# Patient Record
Sex: Female | Born: 1980
Health system: Southern US, Community
[De-identification: ages and names within clinical notes are randomized; demographics above are authoritative.]

## PROBLEM LIST (undated history)

## (undated) DIAGNOSIS — F32A Depression, unspecified: Secondary | ICD-10-CM

## (undated) DIAGNOSIS — E785 Hyperlipidemia, unspecified: Secondary | ICD-10-CM

## (undated) DIAGNOSIS — E7800 Pure hypercholesterolemia, unspecified: Secondary | ICD-10-CM

## (undated) DIAGNOSIS — F329 Major depressive disorder, single episode, unspecified: Secondary | ICD-10-CM

## (undated) DIAGNOSIS — F99 Mental disorder, not otherwise specified: Secondary | ICD-10-CM

## (undated) DIAGNOSIS — D649 Anemia, unspecified: Secondary | ICD-10-CM

## (undated) DIAGNOSIS — I459 Conduction disorder, unspecified: Secondary | ICD-10-CM

## (undated) DIAGNOSIS — F419 Anxiety disorder, unspecified: Secondary | ICD-10-CM

## (undated) DIAGNOSIS — O2230 Deep phlebothrombosis in pregnancy, unspecified trimester: Secondary | ICD-10-CM

## (undated) HISTORY — DX: Hyperlipidemia, unspecified: E78.5

## (undated) HISTORY — DX: Conduction disorder, unspecified: I45.9

---

## 2005-09-30 HISTORY — PX: WISDOM TOOTH EXTRACTION: SHX21

## 2015-05-15 LAB — OB RESULTS CONSOLE RPR: RPR: NONREACTIVE

## 2015-05-15 LAB — OB RESULTS CONSOLE GC/CHLAMYDIA
Chlamydia: NEGATIVE
Gonorrhea: NEGATIVE

## 2015-05-15 LAB — OB RESULTS CONSOLE HIV ANTIBODY (ROUTINE TESTING): HIV: NONREACTIVE

## 2015-05-15 LAB — OB RESULTS CONSOLE RUBELLA ANTIBODY, IGM: Rubella: IMMUNE

## 2015-05-15 LAB — OB RESULTS CONSOLE ABO/RH: RH Type: POSITIVE

## 2015-05-15 LAB — OB RESULTS CONSOLE HEPATITIS B SURFACE ANTIGEN: Hepatitis B Surface Ag: NEGATIVE

## 2015-05-15 LAB — OB RESULTS CONSOLE ANTIBODY SCREEN: Antibody Screen: NEGATIVE

## 2015-12-14 LAB — OB RESULTS CONSOLE GBS: GBS: NEGATIVE

## 2015-12-20 ENCOUNTER — Inpatient Hospital Stay (HOSPITAL_COMMUNITY)
Admission: AD | Admit: 2015-12-20 | Discharge: 2015-12-23 | DRG: 765 | Disposition: A | Discharge status: Home / Self Care | Payer: BLUE CROSS/BLUE SHIELD | Source: Ambulatory Visit | Attending: Obstetrics and Gynecology | Admitting: Obstetrics and Gynecology

## 2015-12-20 ENCOUNTER — Encounter (HOSPITAL_COMMUNITY): Payer: Self-pay | Admitting: *Deleted

## 2015-12-20 DIAGNOSIS — Z3A39 39 weeks gestation of pregnancy: Secondary | ICD-10-CM

## 2015-12-20 DIAGNOSIS — O4202 Full-term premature rupture of membranes, onset of labor within 24 hours of rupture: Secondary | ICD-10-CM | POA: Diagnosis present

## 2015-12-20 DIAGNOSIS — Z87891 Personal history of nicotine dependence: Secondary | ICD-10-CM | POA: Diagnosis not present

## 2015-12-20 DIAGNOSIS — Z3490 Encounter for supervision of normal pregnancy, unspecified, unspecified trimester: Secondary | ICD-10-CM

## 2015-12-20 DIAGNOSIS — D72829 Elevated white blood cell count, unspecified: Secondary | ICD-10-CM | POA: Diagnosis present

## 2015-12-20 DIAGNOSIS — O324XX0 Maternal care for high head at term, not applicable or unspecified: Principal | ICD-10-CM | POA: Diagnosis present

## 2015-12-20 HISTORY — DX: Mental disorder, not otherwise specified: F99

## 2015-12-20 HISTORY — DX: Anxiety disorder, unspecified: F41.9

## 2015-12-20 LAB — CBC
HCT: 38.2 % (ref 36.0–46.0)
Hemoglobin: 13.3 g/dL (ref 12.0–15.0)
MCH: 33.1 pg (ref 26.0–34.0)
MCHC: 34.8 g/dL (ref 30.0–36.0)
MCV: 95 fL (ref 78.0–100.0)
Platelets: 294 10*3/uL (ref 150–400)
RBC: 4.02 MIL/uL (ref 3.87–5.11)
RDW: 13.7 % (ref 11.5–15.5)
WBC: 13.4 10*3/uL — ABNORMAL HIGH (ref 4.0–10.5)

## 2015-12-20 LAB — TYPE AND SCREEN
ABO/RH(D): A POS
Antibody Screen: NEGATIVE

## 2015-12-20 LAB — AMNISURE RUPTURE OF MEMBRANE (ROM) NOT AT ARMC: Amnisure ROM: POSITIVE

## 2015-12-20 LAB — ABO/RH: ABO/RH(D): A POS

## 2015-12-20 MED ORDER — ONDANSETRON HCL 4 MG/2ML IJ SOLN: 4.0000 mg | Freq: Four times a day (QID) | INTRAMUSCULAR | Status: DC | PRN

## 2015-12-20 MED ORDER — LIDOCAINE HCL (PF) 1 % IJ SOLN
30.0000 mL | INTRAMUSCULAR | Status: DC | PRN
  Filled 2015-12-20: qty 30

## 2015-12-20 MED ORDER — OXYTOCIN 10 UNIT/ML IJ SOLN
1.0000 m[IU]/min | INTRAVENOUS | Status: DC
  Administered 2015-12-20: 2 m[IU]/min via INTRAVENOUS

## 2015-12-20 MED ORDER — OXYTOCIN 10 UNIT/ML IJ SOLN
2.5000 [IU]/h | INTRAVENOUS | Status: DC
  Filled 2015-12-20 (×2): qty 10

## 2015-12-20 MED ORDER — LACTATED RINGERS IV SOLN
INTRAVENOUS | Status: DC
  Administered 2015-12-20 – 2015-12-21 (×3): via INTRAVENOUS

## 2015-12-20 MED ORDER — OXYTOCIN BOLUS FROM INFUSION: 500.0000 mL | INTRAVENOUS | Status: DC

## 2015-12-20 MED ORDER — ACETAMINOPHEN 325 MG PO TABS: 650.0000 mg | ORAL_TABLET | ORAL | Status: DC | PRN

## 2015-12-20 MED ORDER — OXYCODONE-ACETAMINOPHEN 5-325 MG PO TABS: 1.0000 | ORAL_TABLET | ORAL | Status: DC | PRN

## 2015-12-20 MED ORDER — CITRIC ACID-SODIUM CITRATE 334-500 MG/5ML PO SOLN
30.0000 mL | ORAL | Status: DC | PRN
  Administered 2015-12-21: 30 mL via ORAL
  Filled 2015-12-20: qty 15

## 2015-12-20 MED ORDER — FLEET ENEMA 7-19 GM/118ML RE ENEM
1.0000 | ENEMA | RECTAL | Status: DC | PRN
Start: 2015-12-20 — End: 2015-12-22

## 2015-12-20 MED ORDER — OXYCODONE-ACETAMINOPHEN 5-325 MG PO TABS: 2.0000 | ORAL_TABLET | ORAL | Status: DC | PRN

## 2015-12-20 MED ORDER — LACTATED RINGERS IV SOLN
500.0000 mL | INTRAVENOUS | Status: DC | PRN
  Administered 2015-12-21: 500 mL via INTRAVENOUS

## 2015-12-20 MED ORDER — TERBUTALINE SULFATE 1 MG/ML IJ SOLN: 0.2500 mg | Freq: Once | INTRAMUSCULAR | Status: DC | PRN

## 2015-12-20 NOTE — H&P (Signed)
Kathy Tyler  DICTATION # [id number] CSN# [id number]   Meriel PicaHOLLAND,[name], MD 12/20/2015 2:35 PM

## 2015-12-20 NOTE — MAU Note (Addendum)
[facility] pt felt some leaking last night, had to change underwear.  Had gush of bloody fluid @ 0900 this morning.  SVE 1 cm.  Had cramping last night, mild uc's today.

## 2015-12-20 NOTE — Progress Notes (Signed)
Dr Marcelle OverlieHolland notified of pt admission status, FHR tracing, SVE, UCs, time of SROM, and GBS status. Orders given for expectant management.

## 2015-12-20 NOTE — H&P (Signed)
NAME:  Kathy CovertKINNEAR, Kathy               ACCOUNT NO.:  [id number]  MEDICAL RECORD NO.:  [id number]  LOCATION:                                 FACILITY:  PHYSICIAN:  Duke Salviaichard M. Marcelle OverlieHolland, M.D.DATE OF BIRTH:  1981-08-28  DATE OF ADMISSION:  12/20/2015 DATE OF DISCHARGE:                             HISTORY & PHYSICAL   CHIEF COMPLAINT:  Rupture of membranes at term.  HISTORY OF PRESENT ILLNESS:  A 35 year old, G2, P0-0-1-0, at term, was seen in the office earlier today with a small gush of fluid.  Nitrazine positive.  Fern negative.  Was sent to MAU  __________ was 8 pounds 2 ounces by ultrasound.  PAST MEDICAL HISTORY:  Please see the [facility] form for details.  PHYSICAL EXAM:  VITAL SIGNS:  Temp 98.2, blood pressure 108/60. HEENT:  Unremarkable. NECK:  Supple without masses. LUNGS:  Clear. CARDIOVASCULAR:  Regular rate and rhythm without murmurs, rubs, or gallops. BREASTS:  Not examined.  Term fundal height.  Fetal heart rate was 142. Cervix was 150%, minimal amount of clear fluid. EXTREMITIES:  Unremarkable. NEUROLOGIC:  Unremarkable.  IMPRESSION:  Term pregnancy, spontaneous rupture of membranes.  PLAN:  GBS is negative.  We anticipate vaginal delivery.     Cesia Orf M. Marcelle OverlieHolland, M.D.     RMH/MEDQ  D:  12/20/2015  T:  12/20/2015  Job:  [id number]

## 2015-12-21 ENCOUNTER — Inpatient Hospital Stay (HOSPITAL_COMMUNITY): Payer: BLUE CROSS/BLUE SHIELD | Admitting: Anesthesiology

## 2015-12-21 ENCOUNTER — Encounter (HOSPITAL_COMMUNITY)
Admission: AD | Disposition: A | Discharge status: Home / Self Care | Payer: Self-pay | Source: Ambulatory Visit | Attending: Obstetrics and Gynecology

## 2015-12-21 LAB — RPR: RPR Ser Ql: NONREACTIVE

## 2015-12-21 SURGERY — Surgical Case
Anesthesia: Epidural | Site: Abdomen

## 2015-12-21 MED ORDER — MEPERIDINE HCL 25 MG/ML IJ SOLN
INTRAMUSCULAR | Status: DC | PRN
  Administered 2015-12-21 (×2): 12.5 mg via INTRAVENOUS

## 2015-12-21 MED ORDER — PHENYLEPHRINE HCL 10 MG/ML IJ SOLN
10000.0000 ug | INTRAMUSCULAR | Status: DC | PRN
  Administered 2015-12-21 (×3): 80 ug via INTRAVENOUS
  Administered 2015-12-21: 40 ug via INTRAVENOUS

## 2015-12-21 MED ORDER — DIPHENHYDRAMINE HCL 50 MG/ML IJ SOLN: 12.5000 mg | INTRAMUSCULAR | Status: DC | PRN

## 2015-12-21 MED ORDER — FENTANYL 2.5 MCG/ML BUPIVACAINE 1/10 % EPIDURAL INFUSION (WH - ANES): 12.0000 mL/h | INTRAMUSCULAR | Status: DC | PRN

## 2015-12-21 MED ORDER — EPHEDRINE 5 MG/ML INJ: 10.0000 mg | INTRAVENOUS | Status: DC | PRN

## 2015-12-21 MED ORDER — LIDOCAINE HCL (PF) 1 % IJ SOLN
INTRAMUSCULAR | Status: DC | PRN
  Administered 2015-12-21 (×2): 4 mL

## 2015-12-21 MED ORDER — FENTANYL CITRATE (PF) 100 MCG/2ML IJ SOLN
INTRAMUSCULAR | Status: AC
  Filled 2015-12-21: qty 2

## 2015-12-21 MED ORDER — ONDANSETRON HCL 4 MG/2ML IJ SOLN
INTRAMUSCULAR | Status: DC | PRN
  Administered 2015-12-21: 4 mg via INTRAVENOUS

## 2015-12-21 MED ORDER — FENTANYL CITRATE (PF) 100 MCG/2ML IJ SOLN
INTRAMUSCULAR | Status: DC | PRN
  Administered 2015-12-21: 100 ug via EPIDURAL

## 2015-12-21 MED ORDER — SCOPOLAMINE 1 MG/3DAYS TD PT72
MEDICATED_PATCH | TRANSDERMAL | Status: DC | PRN
  Administered 2015-12-21: 1 via TRANSDERMAL

## 2015-12-21 MED ORDER — SODIUM CHLORIDE 0.9 % IR SOLN
Status: DC | PRN
  Administered 2015-12-21: 1000 mL

## 2015-12-21 MED ORDER — MORPHINE SULFATE (PF) 0.5 MG/ML IJ SOLN
INTRAMUSCULAR | Status: AC
  Filled 2015-12-21: qty 10

## 2015-12-21 MED ORDER — LACTATED RINGERS IV SOLN
INTRAVENOUS | Status: DC | PRN
  Administered 2015-12-21 (×3): via INTRAVENOUS

## 2015-12-21 MED ORDER — PHENYLEPHRINE 40 MCG/ML (10ML) SYRINGE FOR IV PUSH (FOR BLOOD PRESSURE SUPPORT)
80.0000 ug | PREFILLED_SYRINGE | INTRAVENOUS | Status: DC | PRN
  Filled 2015-12-21: qty 20

## 2015-12-21 MED ORDER — MICROFIBRILLAR COLL HEMOSTAT EX POWD
CUTANEOUS | Status: AC
  Filled 2015-12-21: qty 5

## 2015-12-21 MED ORDER — PHENYLEPHRINE 40 MCG/ML (10ML) SYRINGE FOR IV PUSH (FOR BLOOD PRESSURE SUPPORT): 80.0000 ug | PREFILLED_SYRINGE | INTRAVENOUS | Status: DC | PRN

## 2015-12-21 MED ORDER — OXYTOCIN 10 UNIT/ML IJ SOLN
40.0000 [IU] | INTRAVENOUS | Status: DC | PRN
  Administered 2015-12-21: 40 [IU] via INTRAVENOUS

## 2015-12-21 MED ORDER — MORPHINE SULFATE (PF) 0.5 MG/ML IJ SOLN
INTRAMUSCULAR | Status: DC | PRN
  Administered 2015-12-21: 4 mg via EPIDURAL
  Administered 2015-12-21: .5 mg via INTRAVENOUS
  Administered 2015-12-21: .5 mg via EPIDURAL

## 2015-12-21 MED ORDER — LACTATED RINGERS IV SOLN
500.0000 mL | Freq: Once | INTRAVENOUS | Status: AC
  Administered 2015-12-21: 500 mL via INTRAVENOUS

## 2015-12-21 MED ORDER — CEFAZOLIN SODIUM-DEXTROSE 2-4 GM/100ML-% IV SOLN
2.0000 g | Freq: Once | INTRAVENOUS | Status: AC
  Administered 2015-12-21: 2 g via INTRAVENOUS
  Filled 2015-12-21: qty 100

## 2015-12-21 MED ORDER — SODIUM BICARBONATE 8.4 % IV SOLN
INTRAVENOUS | Status: DC | PRN
  Administered 2015-12-21 (×3): 5 mL via EPIDURAL

## 2015-12-21 MED ORDER — MEPERIDINE HCL 25 MG/ML IJ SOLN
INTRAMUSCULAR | Status: AC
  Filled 2015-12-21: qty 1

## 2015-12-21 MED ORDER — FENTANYL 2.5 MCG/ML BUPIVACAINE 1/10 % EPIDURAL INFUSION (WH - ANES)
14.0000 mL/h | INTRAMUSCULAR | Status: DC | PRN
  Administered 2015-12-21: 14 mL/h via EPIDURAL
  Administered 2015-12-21: 12 mL/h via EPIDURAL
  Filled 2015-12-21 (×2): qty 125

## 2015-12-21 SURGICAL SUPPLY — 27 items
CLAMP CORD UMBIL (MISCELLANEOUS) IMPLANT
CLOTH BEACON ORANGE TIMEOUT ST (SAFETY) ×2 IMPLANT
DRSG OPSITE POSTOP 4X10 (GAUZE/BANDAGES/DRESSINGS) ×2 IMPLANT
DURAPREP 26ML APPLICATOR (WOUND CARE) ×2 IMPLANT
ELECT REM PT RETURN 9FT ADLT (ELECTROSURGICAL) ×2
ELECTRODE REM PT RTRN 9FT ADLT (ELECTROSURGICAL) ×1 IMPLANT
EXTRACTOR VACUUM M CUP 4 TUBE (SUCTIONS) IMPLANT
GLOVE BIO SURGEON STRL SZ 6.5 (GLOVE) ×2 IMPLANT
GLOVE BIOGEL PI IND STRL 7.0 (GLOVE) ×3 IMPLANT
GLOVE BIOGEL PI INDICATOR 7.0 (GLOVE) ×3
GOWN STRL REUS W/TWL LRG LVL3 (GOWN DISPOSABLE) ×4 IMPLANT
KIT ABG SYR 3ML LUER SLIP (SYRINGE) IMPLANT
LIQUID BAND (GAUZE/BANDAGES/DRESSINGS) ×2 IMPLANT
NEEDLE HYPO 25X5/8 SAFETYGLIDE (NEEDLE) IMPLANT
NS IRRIG 1000ML POUR BTL (IV SOLUTION) ×2 IMPLANT
PACK C SECTION WH (CUSTOM PROCEDURE TRAY) ×2 IMPLANT
PAD OB MATERNITY 4.3X12.25 (PERSONAL CARE ITEMS) ×2 IMPLANT
PENCIL SMOKE EVAC W/HOLSTER (ELECTROSURGICAL) ×2 IMPLANT
SUT CHROMIC 0 CT 802H (SUTURE) IMPLANT
SUT CHROMIC 0 CTX 36 (SUTURE) ×6 IMPLANT
SUT MON AB-0 CT1 36 (SUTURE) ×2 IMPLANT
SUT PDS AB 0 CTX 60 (SUTURE) ×2 IMPLANT
SUT PLAIN 0 NONE (SUTURE) IMPLANT
SUT VIC AB 4-0 KS 27 (SUTURE) ×2 IMPLANT
SYR BULB 3OZ (MISCELLANEOUS) ×2 IMPLANT
TOWEL OR 17X24 6PK STRL BLUE (TOWEL DISPOSABLE) ×2 IMPLANT
TRAY FOLEY CATH SILVER 14FR (SET/KITS/TRAYS/PACK) IMPLANT

## 2015-12-21 NOTE — Progress Notes (Signed)
Pt comfortable, feeling mild ctx  FHT cat 1 Toco Q1-2 Cvx 3/70/-2, vtx, AROM forebag IUPC placed  A/P:  Exp mngt Pitocin augmentation Epidural prn

## 2015-12-21 NOTE — Anesthesia Preprocedure Evaluation (Signed)
Anesthesia Evaluation  Patient identified by MRN, date of birth, ID band Patient awake    Reviewed: Allergy & Precautions, NPO status , Patient's Chart, lab work & pertinent test results  Airway Mallampati: II  TM Distance: >3 FB Neck ROM: Full    Dental no notable dental hx.    Pulmonary former smoker,    Pulmonary exam normal breath sounds clear to auscultation       Cardiovascular negative cardio ROS Normal cardiovascular exam Rhythm:Regular Rate:Normal     Neuro/Psych PSYCHIATRIC DISORDERS Anxiety negative neurological ROS     GI/Hepatic negative GI ROS, Neg liver ROS,   Endo/Other  negative endocrine ROS  Renal/GU negative Renal ROS     Musculoskeletal negative musculoskeletal ROS (+)   Abdominal   Peds  Hematology negative hematology ROS (+)   Anesthesia Other Findings   Reproductive/Obstetrics (+) Pregnancy                             Anesthesia Physical Anesthesia Plan  ASA: II  Anesthesia Plan: Epidural   Post-op Pain Management:    Induction:   Airway Management Planned:   Additional Equipment:   Intra-op Plan:   Post-operative Plan:   Informed Consent: I have reviewed the patients History and Physical, chart, labs and discussed the procedure including the risks, benefits and alternatives for the proposed anesthesia with the patient or authorized representative who has indicated his/her understanding and acceptance.     Plan Discussed with:   Anesthesia Plan Comments:         Anesthesia Quick Evaluation

## 2015-12-21 NOTE — Op Note (Signed)
Cesarean Section Procedure Note   Kathy Tyler  12/20/2015 - 12/21/2015  Indications: arrest of descent   Pre-operative Diagnosis: arrest of descent .   Post-operative Diagnosis: Same   Surgeon: Surgeon(s) and Role:    * Zelphia CairoGretchen Rilynne Lonsway, MD - Primary   Assistants: none  Anesthesia: epidural   Procedure Details:  The patient was seen in the Holding Room. The risks, benefits, complications, treatment options, and expected outcomes were discussed with the patient. The patient concurred with the proposed plan, giving informed consent. identified as Kathy Tyler and the procedure verified as C-Section Delivery. A Time Out was held and the above information confirmed.  After induction of anesthesia, the patient was draped and prepped in the usual sterile manner. A transverse was made and carried down through the subcutaneous tissue to the fascia. Fascial incision was made and extended transversely. The fascia was separated from the underlying rectus tissue superiorly and inferiorly. The peritoneum was identified and entered. Peritoneal incision was extended longitudinally. The utero-vesical peritoneal reflection was incised transversely and the bladder flap was bluntly freed from the lower uterine segment. A low transverse uterine incision was made. Delivered from cephalic OP presentation was a vigorous female with Apgar scores of 9 at one minute and 9 at five minutes. Cord ph was not sent the umbilical cord was clamped and cut cord blood was obtained for evaluation. The placenta was removed Intact and appeared normal. The uterine outline, tubes and ovaries appeared normal}. The uterine incision was closed with running locked sutures of 0chromic gut.   Hemostasis was observed. Lavage was carried out until clear.  Left cervical laceration repaired with chromic.  Peritoneum was closed with 0 monocryl.  The fascia was then reapproximated with running sutures of 0PDS.  The skin was closed with 4-0Vicryl.    Instrument, sponge, and needle counts were correct prior the abdominal closure and were correct at the conclusion of the case.    Estimated Blood Loss: 650cc  Urine Output: clearing  Complications: no complications  Disposition: PACU - hemodynamically stable.   Maternal Condition: stable   Baby condition / location:  Couplet care / Skin to Skin  Attending Attestation: I was present and scrubbed for the entire procedure.   Signed: Surgeon(s): Zelphia CairoGretchen Juleen Sorrels, MD

## 2015-12-21 NOTE — Progress Notes (Signed)
Pt comfortable w/ epidural  FHT cat 1 Toco Q2 Cvx 5/90/-2, vtx forebag ruptured for large clear fluid  A/P:  Exp mngt

## 2015-12-21 NOTE — Progress Notes (Signed)
Pt pushing x 1.5 hrs then labored down for several hours Now pushing again x 30 min - very little progress, good effort  FHT reassuring Toco Q2  A/P:  Plan to recheck in - if no progress - rec primary c-section Pt and husband agree with plan of care

## 2015-12-21 NOTE — Anesthesia Procedure Notes (Signed)
Epidural Patient location during procedure: OB  Staffing Anesthesiologist: Lewie LoronGERMEROTH, Maddilynn Esperanza Performed by: anesthesiologist   Preanesthetic Checklist Completed: patient identified, pre-op evaluation, timeout performed, IV checked, risks and benefits discussed and monitors and equipment checked  Epidural Patient position: sitting Prep: site prepped and draped and DuraPrep Patient monitoring: heart rate Approach: midline Injection technique: LOR air and LOR saline  Needle:  Needle type: Tuohy  Needle gauge: 17 G Needle length: 9 cm Needle insertion depth: 4 cm Catheter type: closed end flexible Catheter size: 19 Gauge Catheter at skin depth: 10 cm Test dose: negative  Assessment Sensory level: T8 Events: blood not aspirated, injection not painful, no injection resistance, negative IV test and no paresthesia  Additional Notes Reason for block:procedure for pain

## 2015-12-21 NOTE — Progress Notes (Signed)
Pt with no descent despite > 2 hours of pushing.   Rec primary c-section, pt and husband agree with plan of care R/b/a discussed, questions answered, informed consent

## 2015-12-22 ENCOUNTER — Encounter (HOSPITAL_COMMUNITY): Payer: Self-pay | Admitting: *Deleted

## 2015-12-22 LAB — CBC
HCT: 27.5 % — ABNORMAL LOW (ref 36.0–46.0)
Hemoglobin: 9.8 g/dL — ABNORMAL LOW (ref 12.0–15.0)
MCH: 33.6 pg (ref 26.0–34.0)
MCHC: 35.6 g/dL (ref 30.0–36.0)
MCV: 94.2 fL (ref 78.0–100.0)
Platelets: 233 10*3/uL (ref 150–400)
RBC: 2.92 MIL/uL — ABNORMAL LOW (ref 3.87–5.11)
RDW: 13.8 % (ref 11.5–15.5)
WBC: 33.1 10*3/uL — ABNORMAL HIGH (ref 4.0–10.5)

## 2015-12-22 MED ORDER — SENNOSIDES-DOCUSATE SODIUM 8.6-50 MG PO TABS
2.0000 | ORAL_TABLET | ORAL | Status: DC
  Administered 2015-12-23: 2 via ORAL
  Filled 2015-12-22: qty 2

## 2015-12-22 MED ORDER — DEXAMETHASONE SODIUM PHOSPHATE 4 MG/ML IJ SOLN
INTRAMUSCULAR | Status: AC
  Filled 2015-12-22: qty 1

## 2015-12-22 MED ORDER — IBUPROFEN 600 MG PO TABS
600.0000 mg | ORAL_TABLET | Freq: Four times a day (QID) | ORAL | Status: DC
  Administered 2015-12-22 – 2015-12-23 (×5): 600 mg via ORAL
  Filled 2015-12-22 (×5): qty 1

## 2015-12-22 MED ORDER — SIMETHICONE 80 MG PO CHEW
80.0000 mg | CHEWABLE_TABLET | ORAL | Status: DC
  Administered 2015-12-23: 80 mg via ORAL
  Filled 2015-12-22: qty 1

## 2015-12-22 MED ORDER — OXYTOCIN 10 UNIT/ML IJ SOLN: 2.5000 [IU]/h | INTRAVENOUS | Status: AC

## 2015-12-22 MED ORDER — MEPERIDINE HCL 25 MG/ML IJ SOLN: 6.2500 mg | INTRAMUSCULAR | Status: DC | PRN

## 2015-12-22 MED ORDER — MENTHOL 3 MG MT LOZG: 1.0000 | LOZENGE | OROMUCOSAL | Status: DC | PRN

## 2015-12-22 MED ORDER — FENTANYL CITRATE (PF) 100 MCG/2ML IJ SOLN: 25.0000 ug | INTRAMUSCULAR | Status: DC | PRN

## 2015-12-22 MED ORDER — LACTATED RINGERS IV SOLN
INTRAVENOUS | Status: DC
Start: 2015-12-22 — End: 2015-12-23

## 2015-12-22 MED ORDER — TETANUS-DIPHTH-ACELL PERTUSSIS 5-2.5-18.5 LF-MCG/0.5 IM SUSP: 0.5000 mL | Freq: Once | INTRAMUSCULAR | Status: DC

## 2015-12-22 MED ORDER — KETOROLAC TROMETHAMINE 30 MG/ML IJ SOLN
30.0000 mg | Freq: Four times a day (QID) | INTRAMUSCULAR | Status: AC | PRN
Start: 2015-12-22 — End: 2015-12-23

## 2015-12-22 MED ORDER — ZOLPIDEM TARTRATE 5 MG PO TABS: 5.0000 mg | ORAL_TABLET | Freq: Every evening | ORAL | Status: DC | PRN

## 2015-12-22 MED ORDER — OXYCODONE-ACETAMINOPHEN 5-325 MG PO TABS: 1.0000 | ORAL_TABLET | ORAL | Status: DC | PRN

## 2015-12-22 MED ORDER — DIBUCAINE 1 % RE OINT: 1.0000 "application " | TOPICAL_OINTMENT | RECTAL | Status: DC | PRN

## 2015-12-22 MED ORDER — OXYTOCIN 10 UNIT/ML IJ SOLN
INTRAMUSCULAR | Status: AC
  Filled 2015-12-22: qty 4

## 2015-12-22 MED ORDER — DIPHENHYDRAMINE HCL 25 MG PO CAPS: 25.0000 mg | ORAL_CAPSULE | ORAL | Status: DC | PRN

## 2015-12-22 MED ORDER — DIPHENHYDRAMINE HCL 25 MG PO CAPS: 25.0000 mg | ORAL_CAPSULE | Freq: Four times a day (QID) | ORAL | Status: DC | PRN

## 2015-12-22 MED ORDER — NALBUPHINE HCL 10 MG/ML IJ SOLN: 5.0000 mg | Freq: Once | INTRAMUSCULAR | Status: DC | PRN

## 2015-12-22 MED ORDER — PROMETHAZINE HCL 25 MG/ML IJ SOLN: 6.2500 mg | INTRAMUSCULAR | Status: DC | PRN

## 2015-12-22 MED ORDER — ACETAMINOPHEN 325 MG PO TABS: 650.0000 mg | ORAL_TABLET | ORAL | Status: DC | PRN

## 2015-12-22 MED ORDER — ONDANSETRON HCL 4 MG/2ML IJ SOLN
INTRAMUSCULAR | Status: AC
  Filled 2015-12-22: qty 2

## 2015-12-22 MED ORDER — DIPHENHYDRAMINE HCL 50 MG/ML IJ SOLN: 12.5000 mg | INTRAMUSCULAR | Status: DC | PRN

## 2015-12-22 MED ORDER — NALBUPHINE HCL 10 MG/ML IJ SOLN
5.0000 mg | INTRAMUSCULAR | Status: DC | PRN
Start: 2015-12-22 — End: 2015-12-23

## 2015-12-22 MED ORDER — SIMETHICONE 80 MG PO CHEW: 80.0000 mg | CHEWABLE_TABLET | ORAL | Status: DC | PRN

## 2015-12-22 MED ORDER — SCOPOLAMINE 1 MG/3DAYS TD PT72
MEDICATED_PATCH | TRANSDERMAL | Status: AC
  Filled 2015-12-22: qty 1

## 2015-12-22 MED ORDER — LACTATED RINGERS IV SOLN
INTRAVENOUS | Status: DC
  Administered 2015-12-22: 02:00:00 via INTRAVENOUS

## 2015-12-22 MED ORDER — SIMETHICONE 80 MG PO CHEW
80.0000 mg | CHEWABLE_TABLET | Freq: Three times a day (TID) | ORAL | Status: DC
  Administered 2015-12-22 – 2015-12-23 (×3): 80 mg via ORAL
  Filled 2015-12-22 (×3): qty 1

## 2015-12-22 MED ORDER — NALOXONE HCL 0.4 MG/ML IJ SOLN: 0.4000 mg | INTRAMUSCULAR | Status: DC | PRN

## 2015-12-22 MED ORDER — KETOROLAC TROMETHAMINE 30 MG/ML IJ SOLN: 30.0000 mg | Freq: Four times a day (QID) | INTRAMUSCULAR | Status: AC | PRN

## 2015-12-22 MED ORDER — WITCH HAZEL-GLYCERIN EX PADS: 1.0000 "application " | MEDICATED_PAD | CUTANEOUS | Status: DC | PRN

## 2015-12-22 MED ORDER — DEXAMETHASONE SODIUM PHOSPHATE 4 MG/ML IJ SOLN
INTRAMUSCULAR | Status: DC | PRN
  Administered 2015-12-21: 4 mg via INTRAVENOUS

## 2015-12-22 MED ORDER — NALBUPHINE HCL 10 MG/ML IJ SOLN: 5.0000 mg | INTRAMUSCULAR | Status: DC | PRN

## 2015-12-22 MED ORDER — SODIUM CHLORIDE 0.9% FLUSH: 3.0000 mL | INTRAVENOUS | Status: DC | PRN

## 2015-12-22 MED ORDER — ONDANSETRON HCL 4 MG/2ML IJ SOLN: 4.0000 mg | Freq: Three times a day (TID) | INTRAMUSCULAR | Status: DC | PRN

## 2015-12-22 MED ORDER — NALOXONE HCL 2 MG/2ML IJ SOSY
1.0000 ug/kg/h | PREFILLED_SYRINGE | INTRAVENOUS | Status: DC | PRN
  Filled 2015-12-22: qty 2

## 2015-12-22 MED ORDER — KETOROLAC TROMETHAMINE 30 MG/ML IJ SOLN
INTRAMUSCULAR | Status: AC
  Administered 2015-12-22: 30 mg
  Filled 2015-12-22: qty 1

## 2015-12-22 MED ORDER — PRENATAL MULTIVITAMIN CH
1.0000 | ORAL_TABLET | Freq: Every day | ORAL | Status: DC
  Administered 2015-12-22 – 2015-12-23 (×2): 1 via ORAL
  Filled 2015-12-22 (×2): qty 1

## 2015-12-22 MED ORDER — SCOPOLAMINE 1 MG/3DAYS TD PT72
1.0000 | MEDICATED_PATCH | Freq: Once | TRANSDERMAL | Status: DC
  Filled 2015-12-22: qty 1

## 2015-12-22 MED ORDER — OXYCODONE-ACETAMINOPHEN 5-325 MG PO TABS: 2.0000 | ORAL_TABLET | ORAL | Status: DC | PRN

## 2015-12-22 MED ORDER — LANOLIN HYDROUS EX OINT: 1.0000 "application " | TOPICAL_OINTMENT | CUTANEOUS | Status: DC | PRN

## 2015-12-22 NOTE — Addendum Note (Signed)
Addendum  created 12/22/15 0911 by Jhonnie GarnerBeth M Dalyn Kjos, CRNA   Modules edited: Clinical Notes   Clinical Notes:  File: [id number]

## 2015-12-22 NOTE — Anesthesia Postprocedure Evaluation (Signed)
Anesthesia Post Note  Patient: Kathy Tyler  Procedure(s) Performed: Procedure(s) (LRB): CESAREAN SECTION (N/A)  Patient location during evaluation: Mother Baby Anesthesia Type: Epidural Level of consciousness: oriented and awake and alert Pain management: pain level controlled Vital Signs Assessment: post-procedure vital signs reviewed and stable Respiratory status: spontaneous breathing Cardiovascular status: blood pressure returned to baseline Postop Assessment: no headache, no backache, no signs of nausea or vomiting, adequate PO intake and patient able to bend at knees Anesthetic complications: no    Last Vitals:  Filed Vitals:   12/22/15 0515 12/22/15 0656  BP: 105/62 102/49  Pulse: 72 74  Temp: 36.8 C 36.6 C  Resp: 18 18    Last Pain:  Filed Vitals:   12/22/15 0657  PainSc: 0-No pain                 Elley Harp

## 2015-12-22 NOTE — Lactation Note (Signed)
This note was copied from a baby's chart. Lactation Consultation Note  Patient Name: Kathy Tyler YNWGN'FToday's Date: 12/22/2015 Reason for consult: Initial assessment   With this mom of a term baby, now 5514 hours old. He has breast fed 3 times since birth. Mom had tried football, so I showed her how to position herself and baby for football, and were able to get baby deeply latched to her left breast. Mom has easily expressed colostrum, and the baby is very mellow - sucking on and off, but good breast movement and strong pulls when sucking. Basic breast feeding teaching and lactation services reviewed with mom. Mom very receptive to teaching, and knows to call for questions/concerns.    Maternal Data Formula Feeding for Exclusion: No Has patient been taught Hand Expression?: Yes Does the patient have breastfeeding experience prior to this delivery?: No  Feeding Feeding Type: Breast Fed Length of feed: 15 min (baby suckles on and off, very strong suckles when sucking)  LATCH Score/Interventions Latch: Grasps breast easily, tongue down, lips flanged, rhythmical sucking.  Audible Swallowing: A few with stimulation Intervention(s): Skin to skin;Hand expression  Type of Nipple: Everted at rest and after stimulation  Comfort (Breast/Nipple): Soft / non-tender     Hold (Positioning): Assistance needed to correctly position infant at breast and maintain latch. Intervention(s): Breastfeeding basics reviewed;Support Pillows;Position options;Skin to skin  LATCH Score: 8  Lactation Tools Discussed/Used     Consult Status Consult Status: Follow-up Date: 12/23/15 Follow-up type: In-patient    [name], [name] 12/22/2015, 2:11 PM

## 2015-12-22 NOTE — Progress Notes (Signed)
Subjective: Postpartum Day 1: Cesarean Delivery Patient reports tolerating PO.    Objective: Vital signs in last 24 hours: Temp:  [97.8 F (36.6 C)-99.5 F (37.5 C)] 97.8 F (36.6 C) (03/24 0656) Pulse Rate:  [63-106] 74 (03/24 0656) Resp:  [11-22] 18 (03/24 0656) BP: (97-134)/(49-89) 102/49 mmHg (03/24 0656) SpO2:  [95 %-100 %] 98 % (03/24 0410)  Physical Exam:  General: alert and cooperative Lochia: appropriate Uterine Fundus: firm Incision: healing well DVT Evaluation: No evidence of DVT seen on physical exam. Negative Homan's sign. No cords or calf tenderness. No significant calf/ankle edema. Labial edema noted  Recent Labs  12/20/15 1520 12/22/15 0542  HGB 13.3 9.8*  HCT 38.2 27.5*    Assessment/Plan: Status post Cesarean section. Postoperative course complicated by leukocytosis  Cbc ordered for am.  Vianny Schraeder G 12/22/2015, 7:15 AM

## 2015-12-22 NOTE — Transfer of Care (Signed)
Immediate Anesthesia Transfer of Care Note  Patient: Kathy Tyler  Procedure(s) Performed: Procedure(s): CESAREAN SECTION (N/A)  Patient Location: PACU  Anesthesia Type:Epidural  Level of Consciousness: awake, alert  and oriented  Airway & Oxygen Therapy: Patient Spontanous Breathing  Post-op Assessment: Report given to RN and Post -op Vital signs reviewed and stable  Post vital signs: Reviewed and stable  Last Vitals:  Filed Vitals:   12/21/15 2101 12/21/15 2131  BP: 121/73 118/74  Pulse: 77 95  Temp: 36.8 C   Resp: 20     Complications: No apparent anesthesia complications

## 2015-12-22 NOTE — Clinical Social Work Maternal (Signed)
CLINICAL SOCIAL WORK MATERNAL/CHILD NOTE  Patient Details  Name: Kathy Tyler MRN: [id number] Date of Birth: 06/18/1981  Date:  12/22/2015  Clinical Social Worker Initiating Note:  Keanen Dohse MSW, LCSW Date/ Time Initiated:  12/22/15/1420     Child's Name:  Kathy Tyler   Legal Guardian:  Reed and Kasey Shenk  Need for Interpreter:  None   Date of Referral:  12/21/15     Reason for Referral:  History of anxiety  Referral Source:  Central Nursery   Address:  [address] [place], [place] [address]  Phone number:  [phone]   Household Members:  Spouse   Natural Supports (not living in the home):  Extended Family, Immediate Family   Professional Supports: None   Employment: Full-time   Type of Work:   Customer service  Education:  College graduate   Financial Resources:  Private Insurance   Other Resources:    None identified   Cultural/Religious Considerations Which May Impact Care:  None reported  Strengths:  Ability to meet basic needs , Home prepared for child , Pediatrician chosen    Risk Factors/Current Problems:   1. Mental Health Concerns-- [name] presents with a history of anxiety/OCD. [name] reported that all medications were discontinued approximately one year ago in anticipating of a pregnancy. [name] denied change in symptoms or presence of acute anxiety during the pregnancy.    Cognitive State:  Able to Concentrate , Alert , Goal Oriented , Linear Thinking , Insightful    Mood/Affect:  Happy , Animated, Bright , Calm , Comfortable    CSW Assessment:  CSW received request for consult due to [name] presenting with a history of anxiety.  [name] was noted to be in a pleasant mood and displayed a full range in affect. She was receptive to the CSW, easily engaged, and expressed appreciation for the visit.  [name] stated that she experienced a "long" childbirth experience, and reported that she had anticipated a C-section due to her narrow hips. [name] shared that  she had mentally prepared for a potential C-section prior to delivery, and had sufficient time while in L&D to prepare for the procedure.  [name] denied belief that the procedure was "scary", and shared that the recovery is what she anticipated and expect.  [name] expressed initial difficulties with breastfeeding, but stated that it is congruent with what she anticipated and expected. She stated that she been working with the LC, and has found it helpful. Per [name], she feels happy and excited as she anticipates returning home. She stated that both of their families live nearby, and that she feels well supported. [name] denied any barriers to asking for and accepting help.    [name] confirmed history of anxiety and OCD. She stated that she has "always" had symptoms.  Per [name], she has a history of medication management by her PCP for symptoms, and all medications were discontinued one year ago as they prepared for the pregnancy. [name] denied difficulties with discontinuing medication, and shared belief that it was closely related to her being distracted by preparations for the pregnancy and preparing for the infant.  [name] denied presence of acute symptoms, and stated that she had occassionally anxiety, but it never interfered with her activities of daily living, relationships, or ability to sleep.  [name] stated that she has concerns about her risk for perinatal mood disorders due to her history of anxiety. She presented with previous awareness of her increased risk.  CSW provided education on the baby blues and perinatal   mood and anxiety disorders. [name] expressed appreciation to know what to look for, and shared that she will closely monitor and will follow up with her PCP or OB if she notes onset of symptoms. CSW reviewed best practices for perinatal mood disorders, and highlighted non-pharmalogical interventions that can help to support her mental health as she now transitions postpartum.  [name] denied questions, concerns, or needs at  this time. She acknowledged ongoing CSW availability, and agreed to contact CSW if needs arise.  CSW Plan/Description:   1. Patient/Family Education-- perinatal mood and anxiety disorders 2. Information/Referral to Community Resources- Feelings After Birth support group 3. No Further Intervention Required/No Barriers to Discharge    Vic Esco N, LCSW 12/22/2015, 2:56 PM  

## 2015-12-22 NOTE — Anesthesia Postprocedure Evaluation (Signed)
Anesthesia Post Note  Patient: Kathy Tyler  Procedure(s) Performed: Procedure(s) (LRB): CESAREAN SECTION (N/A)  Patient location during evaluation: PACU Anesthesia Type: Epidural Level of consciousness: oriented and awake and alert Pain management: pain level controlled Vital Signs Assessment: post-procedure vital signs reviewed and stable Respiratory status: spontaneous breathing, respiratory function stable and patient connected to nasal cannula oxygen Cardiovascular status: blood pressure returned to baseline and stable Postop Assessment: no headache, no backache and epidural receding Anesthetic complications: no    Last Vitals:  Filed Vitals:   12/22/15 0130 12/22/15 0145  BP: 106/68 111/62  Pulse: 89 76  Temp:  37.5 C  Resp: 20 19    Last Pain:  Filed Vitals:   12/22/15 0152  PainSc: 2                  Shelton SilvasKevin D Kurt Azimi

## 2015-12-23 ENCOUNTER — Encounter (HOSPITAL_COMMUNITY): Payer: Self-pay | Admitting: Obstetrics and Gynecology

## 2015-12-23 LAB — CBC
HCT: 23.5 % — ABNORMAL LOW (ref 36.0–46.0)
Hemoglobin: 8.2 g/dL — ABNORMAL LOW (ref 12.0–15.0)
MCH: 33.3 pg (ref 26.0–34.0)
MCHC: 34.9 g/dL (ref 30.0–36.0)
MCV: 95.5 fL (ref 78.0–100.0)
Platelets: 198 10*3/uL (ref 150–400)
RBC: 2.46 MIL/uL — ABNORMAL LOW (ref 3.87–5.11)
RDW: 14.1 % (ref 11.5–15.5)
WBC: 18 10*3/uL — ABNORMAL HIGH (ref 4.0–10.5)

## 2015-12-23 MED ORDER — OXYCODONE-ACETAMINOPHEN 5-325 MG PO TABS: 1.0000 | ORAL_TABLET | Freq: Four times a day (QID) | ORAL | Status: DC | PRN

## 2015-12-23 MED ORDER — IBUPROFEN 600 MG PO TABS: 600.0000 mg | ORAL_TABLET | Freq: Four times a day (QID) | ORAL | Status: DC

## 2015-12-23 NOTE — Lactation Note (Signed)
This note was copied from a baby's chart. Lactation Consultation Note  Patient Name: Kathy Tyler's Date: 12/23/2015 Reason for consult: Follow-up assessment Nurse called for LC to come talk with mom before discharge. Baby was BF on left breast in football hold- mom was bent over. Suggested mom add more pillow support so she can sit up straight & not have a sore back later. Parents report the nurse told them to put infant in direct sunlight & they will go to the pediatrician tomorrow morning for a check-up. Encouraged mom to BF on cue at least 8-12x/d. Also informed mom that if at the pediatrician they are concerned about his bili or wt that she could post-pump with her DEBP at home and then spoon feed it to infant after BF. Reminded mom of lactation o/p number if they have further questions once they're home. Mom was unsure how to notice swallows so LC showed mom; mom reported that she had heard them earlier but wasn't aware that they were swallows. Mom reported some soreness, especially at the beginning of a feed but it gets better; explained to mom that this can be normal but that it should not be painful during the entire feed or after BF. After baby came off, encouraged mom to rub expressed breast milk into her nipple (mom demonstrated hand expression very well & drops were seen right away). Mom's nipple was a little pinched so encouraged mom to aim to have infant's mouth wider/ deeper latch at future feeds. Parents report no other questions at this time.  Maternal Data    Feeding Feeding Type: Breast Fed Length of feed: 15 min  LATCH Score/Interventions                      Lactation Tools Discussed/Used     Consult Status Consult Status: Complete    Kathy Tyler 12/23/2015, 3:57 PM

## 2015-12-23 NOTE — Progress Notes (Signed)
Subjective: Postpartum Day 2: Cesarean Delivery Patient reports tolerating PO, + flatus and no problems voiding.    Objective: Vital signs in last 24 hours: Temp:  [97.4 F (36.3 C)-98.6 F (37 C)] 98 F (36.7 C) (03/25 0554) Pulse Rate:  [57-82] 57 (03/25 0554) Resp:  [18-20] 20 (03/25 0554) BP: (93-107)/(51-64) 93/51 mmHg (03/25 0554)  Physical Exam:  General: alert, cooperative and no distress Lochia: appropriate Uterine Fundus: firm Incision: healing well DVT Evaluation: No evidence of DVT seen on physical exam.   Recent Labs  12/22/15 0542 12/23/15 0615  HGB 9.8* 8.2*  HCT 27.5* 23.5*    Assessment/Plan: Status post Cesarean section. Doing well postoperatively.  Discharge home with standard precautions and return to clinic in 4-6 weeks.  Usama Harkless II,Pepper Kerrick E 12/23/2015, 8:06 AM

## 2015-12-23 NOTE — Lactation Note (Signed)
This note was copied from a baby's chart. Lactation Consultation Note  Baby recently breastfed for 45 min.  Mother states breastfeeding going well. Nipples slightly sore.  Recommend applying ebm and coconut oil. Gave mother hand pump.  Mom encouraged to feed baby 8-12 times/24 hours and with feeding cues.  Reviewed OP services. Reviewed engorgement care and monitoring voids/stools.   Patient Name: Boy Leonia Readerbbie Woolum HYQMV'HToday's Date: 12/23/2015 Reason for consult: Follow-up assessment   Maternal Data    Feeding Feeding Type: Breast Fed Length of feed: 45 min  LATCH Score/Interventions                      Lactation Tools Discussed/Used     Consult Status Consult Status: Complete    Hardie PulleyBerkelhammer, Ruth Boschen 12/23/2015, 8:34 AM

## 2015-12-23 NOTE — Discharge Summary (Signed)
Obstetric Discharge Summary Reason for Admission: rupture of membranes Prenatal Procedures: none Intrapartum Procedures: cesarean: low cervical, transverse Postpartum Procedures: none Complications-Operative and Postpartum: none HEMOGLOBIN  Date Value Ref Range Status  12/23/2015 8.2* 12.0 - 15.0 g/dL Final   HCT  Date Value Ref Range Status  12/23/2015 23.5* 36.0 - 46.0 % Final    Physical Exam:  General: alert, no distress Lochia: appropriate Uterine Fundus: firm Incision: healing well DVT Evaluation: No evidence of DVT seen on physical exam.  Discharge Diagnoses: Term Pregnancy-delivered  Discharge Information: Date: 12/23/2015 Activity: pelvic rest Diet: routine Medications: PNV, Ibuprofen and Percocet Condition: stable Instructions: refer to practice specific booklet Discharge to: home   Newborn Data: Live born female  Birth Weight: 8 lb 9.2 oz (3890 g) APGAR: 9, 9  Home with mother.  Kathy Tyler,Kathy Tyler E 12/23/2015, 8:08 AM

## 2016-01-01 NOTE — Addendum Note (Signed)
Addendum  created 01/01/16 0732 by Shelton SilvasKevin D Aleayah Chico, MD   Modules edited: Anesthesia Responsible Staff

## 2016-02-05 DIAGNOSIS — Z1389 Encounter for screening for other disorder: Secondary | ICD-10-CM | POA: Diagnosis not present

## 2016-07-10 DIAGNOSIS — Z23 Encounter for immunization: Secondary | ICD-10-CM | POA: Diagnosis not present

## 2016-07-22 DIAGNOSIS — F53 Puerperal psychosis: Secondary | ICD-10-CM | POA: Diagnosis not present

## 2016-08-01 DIAGNOSIS — H52223 Regular astigmatism, bilateral: Secondary | ICD-10-CM | POA: Diagnosis not present

## 2016-10-04 DIAGNOSIS — J0110 Acute frontal sinusitis, unspecified: Secondary | ICD-10-CM | POA: Diagnosis not present

## 2016-11-08 DIAGNOSIS — R6889 Other general symptoms and signs: Secondary | ICD-10-CM | POA: Diagnosis not present

## 2016-11-08 DIAGNOSIS — J329 Chronic sinusitis, unspecified: Secondary | ICD-10-CM | POA: Diagnosis not present

## 2017-05-27 DIAGNOSIS — N911 Secondary amenorrhea: Secondary | ICD-10-CM | POA: Diagnosis not present

## 2017-06-03 DIAGNOSIS — Z3685 Encounter for antenatal screening for Streptococcus B: Secondary | ICD-10-CM | POA: Diagnosis not present

## 2017-06-03 DIAGNOSIS — Z3481 Encounter for supervision of other normal pregnancy, first trimester: Secondary | ICD-10-CM | POA: Diagnosis not present

## 2017-06-03 LAB — OB RESULTS CONSOLE RUBELLA ANTIBODY, IGM: Rubella: IMMUNE

## 2017-06-03 LAB — OB RESULTS CONSOLE HEPATITIS B SURFACE ANTIGEN: Hepatitis B Surface Ag: NEGATIVE

## 2017-06-03 LAB — OB RESULTS CONSOLE HIV ANTIBODY (ROUTINE TESTING): HIV: NONREACTIVE

## 2017-06-03 LAB — OB RESULTS CONSOLE RPR: RPR: NONREACTIVE

## 2017-06-03 LAB — OB RESULTS CONSOLE ABO/RH: RH Type: POSITIVE

## 2017-06-03 LAB — OB RESULTS CONSOLE ANTIBODY SCREEN: Antibody Screen: NEGATIVE

## 2017-06-03 LAB — OB RESULTS CONSOLE GC/CHLAMYDIA
Chlamydia: NEGATIVE
Gonorrhea: NEGATIVE

## 2017-06-19 DIAGNOSIS — Z331 Pregnant state, incidental: Secondary | ICD-10-CM | POA: Diagnosis not present

## 2017-06-19 DIAGNOSIS — Z124 Encounter for screening for malignant neoplasm of cervix: Secondary | ICD-10-CM | POA: Diagnosis not present

## 2017-06-19 DIAGNOSIS — Z3480 Encounter for supervision of other normal pregnancy, unspecified trimester: Secondary | ICD-10-CM | POA: Diagnosis not present

## 2017-06-19 DIAGNOSIS — Z113 Encounter for screening for infections with a predominantly sexual mode of transmission: Secondary | ICD-10-CM | POA: Diagnosis not present

## 2017-06-19 DIAGNOSIS — Z3A10 10 weeks gestation of pregnancy: Secondary | ICD-10-CM | POA: Diagnosis not present

## 2017-06-19 DIAGNOSIS — Z01419 Encounter for gynecological examination (general) (routine) without abnormal findings: Secondary | ICD-10-CM | POA: Diagnosis not present

## 2017-06-19 DIAGNOSIS — Z3400 Encounter for supervision of normal first pregnancy, unspecified trimester: Secondary | ICD-10-CM | POA: Diagnosis not present

## 2017-06-30 DIAGNOSIS — Z3491 Encounter for supervision of normal pregnancy, unspecified, first trimester: Secondary | ICD-10-CM | POA: Diagnosis not present

## 2017-06-30 DIAGNOSIS — Z36 Encounter for antenatal screening for chromosomal anomalies: Secondary | ICD-10-CM | POA: Diagnosis not present

## 2017-06-30 DIAGNOSIS — Z3683 Encounter for fetal screening for congenital cardiac abnormalities: Secondary | ICD-10-CM | POA: Diagnosis not present

## 2017-07-07 DIAGNOSIS — H10413 Chronic giant papillary conjunctivitis, bilateral: Secondary | ICD-10-CM | POA: Diagnosis not present

## 2017-07-14 DIAGNOSIS — Z23 Encounter for immunization: Secondary | ICD-10-CM | POA: Diagnosis not present

## 2017-07-28 DIAGNOSIS — J069 Acute upper respiratory infection, unspecified: Secondary | ICD-10-CM | POA: Diagnosis not present

## 2017-07-28 DIAGNOSIS — Z3A16 16 weeks gestation of pregnancy: Secondary | ICD-10-CM | POA: Diagnosis not present

## 2017-07-30 DIAGNOSIS — H1045 Other chronic allergic conjunctivitis: Secondary | ICD-10-CM | POA: Diagnosis not present

## 2017-07-30 DIAGNOSIS — J309 Allergic rhinitis, unspecified: Secondary | ICD-10-CM | POA: Diagnosis not present

## 2017-08-14 DIAGNOSIS — Z3480 Encounter for supervision of other normal pregnancy, unspecified trimester: Secondary | ICD-10-CM | POA: Diagnosis not present

## 2017-08-14 DIAGNOSIS — Z363 Encounter for antenatal screening for malformations: Secondary | ICD-10-CM | POA: Diagnosis not present

## 2017-08-14 DIAGNOSIS — Z3A18 18 weeks gestation of pregnancy: Secondary | ICD-10-CM | POA: Diagnosis not present

## 2017-08-28 ENCOUNTER — Ambulatory Visit: Payer: Self-pay | Admitting: Allergy & Immunology

## 2017-08-28 DIAGNOSIS — H019 Unspecified inflammation of eyelid: Secondary | ICD-10-CM | POA: Diagnosis not present

## 2017-09-25 DIAGNOSIS — O2230 Deep phlebothrombosis in pregnancy, unspecified trimester: Secondary | ICD-10-CM | POA: Diagnosis not present

## 2017-09-25 DIAGNOSIS — O2232 Deep phlebothrombosis in pregnancy, second trimester: Secondary | ICD-10-CM | POA: Diagnosis not present

## 2017-09-25 DIAGNOSIS — Z3A24 24 weeks gestation of pregnancy: Secondary | ICD-10-CM | POA: Diagnosis not present

## 2017-09-25 DIAGNOSIS — I82409 Acute embolism and thrombosis of unspecified deep veins of unspecified lower extremity: Secondary | ICD-10-CM | POA: Diagnosis not present

## 2017-09-25 DIAGNOSIS — I824Z2 Acute embolism and thrombosis of unspecified deep veins of left distal lower extremity: Secondary | ICD-10-CM | POA: Diagnosis not present

## 2017-09-29 DIAGNOSIS — Z3A25 25 weeks gestation of pregnancy: Secondary | ICD-10-CM | POA: Diagnosis not present

## 2017-09-29 DIAGNOSIS — Z3480 Encounter for supervision of other normal pregnancy, unspecified trimester: Secondary | ICD-10-CM | POA: Diagnosis not present

## 2017-10-08 ENCOUNTER — Telehealth: Payer: Self-pay | Admitting: Hematology

## 2017-10-08 NOTE — Telephone Encounter (Signed)
Spoke with patient regarding appointment D/T/Loc/Ph# °

## 2017-10-10 DIAGNOSIS — Z3480 Encounter for supervision of other normal pregnancy, unspecified trimester: Secondary | ICD-10-CM | POA: Diagnosis not present

## 2017-10-10 DIAGNOSIS — Z23 Encounter for immunization: Secondary | ICD-10-CM | POA: Diagnosis not present

## 2017-10-14 ENCOUNTER — Inpatient Hospital Stay: Payer: BLUE CROSS/BLUE SHIELD | Attending: Hematology | Admitting: Hematology

## 2017-10-14 ENCOUNTER — Telehealth: Payer: Self-pay | Admitting: Hematology

## 2017-10-14 ENCOUNTER — Inpatient Hospital Stay: Payer: BLUE CROSS/BLUE SHIELD

## 2017-10-14 ENCOUNTER — Encounter: Payer: Self-pay | Admitting: Hematology

## 2017-10-14 VITALS — BP 101/58 | HR 84 | Temp 98.6°F | Resp 18 | Ht 62.0 in | Wt 142.1 lb

## 2017-10-14 DIAGNOSIS — O2230 Deep phlebothrombosis in pregnancy, unspecified trimester: Secondary | ICD-10-CM

## 2017-10-14 DIAGNOSIS — O99012 Anemia complicating pregnancy, second trimester: Secondary | ICD-10-CM

## 2017-10-14 DIAGNOSIS — D509 Iron deficiency anemia, unspecified: Secondary | ICD-10-CM

## 2017-10-14 DIAGNOSIS — Z7901 Long term (current) use of anticoagulants: Secondary | ICD-10-CM

## 2017-10-14 DIAGNOSIS — I82402 Acute embolism and thrombosis of unspecified deep veins of left lower extremity: Secondary | ICD-10-CM

## 2017-10-14 DIAGNOSIS — Z3A27 27 weeks gestation of pregnancy: Secondary | ICD-10-CM

## 2017-10-14 DIAGNOSIS — O2232 Deep phlebothrombosis in pregnancy, second trimester: Secondary | ICD-10-CM | POA: Diagnosis not present

## 2017-10-14 LAB — IRON AND TIBC
Iron: 92 ug/dL (ref 41–142)
Saturation Ratios: 20 % — ABNORMAL LOW (ref 21–57)
TIBC: 466 ug/dL — ABNORMAL HIGH (ref 236–444)
UIBC: 374 ug/dL

## 2017-10-14 LAB — FERRITIN: Ferritin: 12 ng/mL (ref 9–269)

## 2017-10-14 NOTE — Telephone Encounter (Signed)
Scheduled appt per 1/15 los - Gave patient AVS and calender per los.  

## 2017-10-14 NOTE — Progress Notes (Signed)
HEMATOLOGY/ONCOLOGY CONSULTATION NOTE  Date of Service: 10/14/2017  Patient Care Team: Zelphia Cairo, MD as PCP - General (Obstetrics and Gynecology)  CHIEF COMPLAINTS/PURPOSE OF CONSULTATION:  DVT - Pregnant   HISTORY OF PRESENTING ILLNESS:   Kathy Tyler is a wonderful 37 y.o. female who has been referred to [place] by OBGYN Dr. Renaldo Fiddler, from [facility], for evaluation and management of DVT during pregnancy.   She presents to the clinic today [redacted] weeks pregnant accompanied by her mother. She notes she had pain in her left leg above her knee around Christmas time. She was diagnosed with DVT on 09/25/17 (Nonocclusive deep venous thrombosis left lower extremity within the tibial peroneal trunk (the proximal calf).)after presenting to the ED for pain in her left leg. The ED treated her with Lovenox injections 60mg  BID and she has continued this regimen. Since then she had pains in her legs occured for 3 days and then went away. She denies bleeding from Lovenox and has occasional bruising at injection site. She sees her OBGYN every 2 weeks now and her expected due date is 3 months from now.   She had a C-section in 2017 from her first child birth. After the surgery she has several vaginal clots.  She notes after her first C-section had some anemia. She did not need iron replacements or blood transfusions.  She is considering a C-section again for this pregnancy.  She denies family history of blood clots or blood disorders.  She has a Gyn history of G3P1A1. She had a miscarriage in 2016 at 5 weeks. She denies significant history of smoking. 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 She denies having varicose veins before. In the past she was on oral contraceptives for a total of 5-10 year with no concern for VTE. She has been on Zoloft in the past for anxiety, but no longer on this medication.  On review of symptoms, pt notes having redness,  itching and swelling around her eyes recently. PCP and dermatologist  suspect her reactions to be from environmental allergies. She denies any significant diagnosis. She notes no CP or SOB. She denies abnormal leg swelling or discoloration.    MEDICAL HISTORY:  Past Medical History:  Diagnosis Date  . Anxiety   . Mental disorder     SURGICAL HISTORY: Past Surgical History:  Procedure Laterality Date  . CESAREAN SECTION N/A 12/21/2015   Procedure: CESAREAN SECTION;  Surgeon: Zelphia Cairo, MD;  Location: [facility];  Service: Obstetrics;  Laterality: N/A;  . WISDOM TOOTH EXTRACTION  2007    SOCIAL HISTORY: Social History   Socioeconomic History  . Marital status: Married    Spouse name: Not on file  . Number of children: Not on file  . Years of education: Not on file  . Highest education level: Not on file  Social Needs  . Financial resource strain: Not on file  . Food insecurity - worry: Not on file  . Food insecurity - inability: Not on file  . Transportation needs - medical: Not on file  . Transportation needs - non-medical: Not on file  Occupational History  . Not on file  Tobacco Use  . Smoking status: Never Smoker  . Smokeless tobacco: Never Used  Substance and Sexual Activity  . Alcohol use: No  . Drug use: No  . Sexual activity: Yes    Birth control/protection: None  Other Topics Concern  .  Not on file  Social History Narrative  . Not on file    FAMILY HISTORY: History reviewed. No pertinent family history.  ALLERGIES:  is allergic to tobramycin.  MEDICATIONS:  Current Outpatient Medications  Medication Sig Dispense Refill  . desonide (DESOWEN) 0.05 % ointment Apply 1 application topically 2 (two) times a week.    . enoxaparin (LOVENOX) 60 MG/0.6ML injection Inject 60 mg into the skin every 12 (twelve) hours.    . Prenat w/o A-FeCbn-Meth-FA-DHA (PRENATE MINI PO) Take by mouth daily.     No current facility-administered medications for this visit.      REVIEW OF SYSTEMS:    10 Point review of Systems was done is negative except as noted above.  PHYSICAL EXAMINATION: ECOG PERFORMANCE STATUS: 0 - Asymptomatic  . Vitals:   10/14/17 1059  BP: (!) 101/58  Pulse: 84  Resp: 18  Temp: 98.6 F (37 C)  SpO2: 99%   Filed Weights   10/14/17 1059  Weight: 142 lb 1.6 oz (64.5 kg)   .Body mass index is 25.99 kg/m.  GENERAL:alert, in no acute distress and comfortable SKIN: no acute rashes, no significant lesions EYES: conjunctiva are pink and non-injected, sclera anicteric OROPHARYNX: MMM, no exudates, no oropharyngeal erythema or ulceration NECK: supple, no JVD LYMPH:  no palpable lymphadenopathy in the cervical, axillary or inguinal regions LUNGS: clear to auscultation b/l with normal respiratory effort HEART: regular rate & rhythm ABDOMEN:  normoactive bowel sounds , non tender, not distended. Extremity: no pedal edema, no calf redness or tenderness to palpation. PSYCH: alert & oriented x 3 with fluent speech NEURO: no focal motor/sensory deficits  LABORATORY DATA:  I have reviewed the data as listed   Component     Latest Ref Rng & Units 10/14/2017  Iron     41 - 142 ug/dL 92  TIBC     161 - 096 ug/dL 045 (H)  Saturation Ratios     21 - 57 % 20 (L)  UIBC     ug/dL 409  Anticardiolipin Ab,IgG,Qn     0 - 14 GPL U/mL <9  Anticardiolipin Ab,IgM,Qn     0 - 12 MPL U/mL 10  Anticardiolipin Ab,IgA,Qn     0 - 11 APL U/mL <9  PTT Lupus Anticoagulant     0.0 - 51.9 sec 33.8  DRVVT     0.0 - 47.0 sec 34.8  Lupus Anticoag Interp      Comment:  Beta-2 Glycoprotein I Ab, IgG     0 - 20 GPI IgG units <9  Beta-2-Glycoprotein I IgM     0 - 32 GPI IgM units <9  Beta-2-Glycoprotein I IgA     0 - 25 GPI IgA units <9  Ferritin     9 - 269 ng/mL 12    Lupus anticoagulant panel      The value has a corrected status.      No reference range information available      Resulting Lab: [facility]       Comments:           (NOTE)           No lupus anticoagulant was detected.  Pending factor V Leiden mutation and prothrombin gene mutation.  RADIOGRAPHIC STUDIES: I have personally reviewed the radiological images as listed and agreed with the findings in the report. No results found.   [place] LOWER EXTREMITY VENOUS DUPLEX LEFT 09/25/17  FINDINGS: Grayscale, color, and pulse wave  Doppler evaluation of the deep venous system of the left lower 70 was performed. There is nonocclusive thrombus seen within the proximal left calf within the tibial peroneal trunk which is incompletely compressible. All other veins of the deep venous system of the left lower extremity from the common femoral vein through the popliteal vein demonstrate compression augmentation, and spontaneous phasic venous flow. No evidence of deep venous thrombosis within the contralateral right common femoral vein. IMPRESSION: 1. Nonocclusive deep venous thrombosis left lower extremity within the tibial peroneal trunk (the proximal calf).   ASSESSMENT & PLAN:   Kamee Bobst is a 37 y.o. female with    1. Acute non occlusive no symptoms suggestive of pulmonary embolism DVT  left lower extremity within the tibial peroneal trunk (the proximal calf).- diagnosed at [redacted] weeks pregnant . -09/25/17 Doppler shows a below-the-knee non-occlusive blood clot is low risk.  -No symptoms suggestive of pulmonary embolism. -No previous personal history or family history of venous thromboembolism. Plan -I discussed that with no other provocation for her DVT, it is most likely provoked by her pregnancy.  -she is currently at 27 weeks pregnancy and notes resolution of her LLE symptoms after being on Lovenox 60mg  [place] Q12h for the last 3 weeks. -Given her lack of personal or family history of blood clots genetic lab work up is up to the Pt. She is keen to r/o other potential trigger factors especially since she has not had a previous hormonally triggered  event with OCP or her 1st pregnancy.  -Currently on Lovenox injections 60mg  BID given her pregnancy. I recommend continuing full dose regimen until labor -then for at least 6 weeks after delivery we will do preventive dose lovenox at 60mg  once daily.  -I recommend using compression socks and elevation of her feet when she sits.  -Pt is considering a planned C-section. If she proceeds with C-section she will need to hold her lovenox 24h before surgery.  -I discussed if this is a hormonal triggered blood clot, I do not recommend oral contraception use in the future and it would be recommended to do preventive dose blood thinners if she became pregnant again in the future.  2. Iron deficiency during pregnancy . Lab Results  Component Value Date   IRON 92 10/14/2017   TIBC 466 (H) 10/14/2017   IRONPCTSAT 20 (L) 10/14/2017   (Iron and TIBC)  Lab Results  Component Value Date   FERRITIN 12 10/14/2017   Plan -would recommend taking Iron polysaccharide 150mg  po daily in addition to Prenatal vitamins - Labs today  RTC with Dr. Candise Che in 2 months with labs   All of the patients questions were answered with apparent satisfaction. The patient knows to call the clinic with any problems, questions or concerns.  I spent 35 minutes counseling the patient face to face. The total time spent in the appointment was 45 minutes and more than 50% was on counseling and direct patient cares.    Wyvonnia Lora MD MS AAHIVMS [facility] Hematology/Oncology Physician [facility]  (Office):       [phone] (Work cell):  [phone] (Fax):           [phone]  10/14/2017 11:05 AM  This document serves as a record of services personally performed by Wyvonnia Lora, MD. It was created on his behalf by Delphina Cahill, a trained medical scribe. The creation of this record is based on the scribe's personal observations and the provider's statements to them.    Marland Kitchen  I have reviewed the above  documentation for accuracy and completeness, and I agree with the above. Johney Maine.Gautam Kishore Kale MD MS

## 2017-10-15 LAB — LUPUS ANTICOAGULANT PANEL
DRVVT: 34.8 s (ref 0.0–47.0)
PTT Lupus Anticoagulant: 33.8 s (ref 0.0–51.9)

## 2017-10-16 LAB — BETA-2-GLYCOPROTEIN I ABS, IGG/M/A
Beta-2 Glyco I IgG: <9 GPI IgG units (ref 0–20)
Beta-2-Glycoprotein I IgA: <9 GPI IgA units (ref 0–25)
Beta-2-Glycoprotein I IgM: <9 GPI IgM units (ref 0–32)

## 2017-10-17 LAB — CARDIOLIPIN ANTIBODIES, IGG, IGM, IGA
Anticardiolipin IgA: <9 APL U/mL (ref 0–11)
Anticardiolipin IgG: <9 GPL U/mL (ref 0–14)
Anticardiolipin IgM: 10 MPL U/mL (ref 0–12)

## 2017-10-20 ENCOUNTER — Telehealth: Payer: Self-pay | Admitting: Medical Oncology

## 2017-10-20 ENCOUNTER — Telehealth: Payer: Self-pay | Admitting: *Deleted

## 2017-10-20 LAB — FACTOR 5 LEIDEN

## 2017-10-20 LAB — PROTHROMBIN GENE MUTATION

## 2017-10-20 NOTE — Telephone Encounter (Signed)
Per Dr. Candise CheKale staff message, LVM with patient instructing to take OTC iron polysaccharide 150mg  daily in addition to prenatal vitamins.  Call back number provided for questions/concerns.

## 2017-10-20 NOTE — Telephone Encounter (Signed)
rr

## 2017-10-27 DIAGNOSIS — D649 Anemia, unspecified: Secondary | ICD-10-CM | POA: Diagnosis not present

## 2017-11-12 DIAGNOSIS — Z3480 Encounter for supervision of other normal pregnancy, unspecified trimester: Secondary | ICD-10-CM | POA: Diagnosis not present

## 2017-11-25 DIAGNOSIS — D649 Anemia, unspecified: Secondary | ICD-10-CM | POA: Diagnosis not present

## 2017-12-02 NOTE — Progress Notes (Signed)
HEMATOLOGY/ONCOLOGY CLINIC NOTE  Date of Service: 12/12/2017  Patient Care Team: Zelphia Cairo, MD as PCP - General (Obstetrics and Gynecology)  CHIEF COMPLAINTS:  DVT - Pregnant   HISTORY OF PRESENTING ILLNESS:   Kathy Tyler is a wonderful 37 y.o. female who has been referred to [place] by OBGYN Dr. Renaldo Fiddler[facility], for evaluation and management of DVT during pregnancy.   She presents to the clinic today [redacted] weeks pregnant accompanied by her mother. She notes she had pain in her left leg above her knee around Christmas time. She was diagnosed with DVT on 09/25/17 (Nonocclusive deep venous thrombosis left lower extremity within the tibial peroneal trunk (the proximal calf).)after presenting to the ED for pain in her left leg. The ED treated her with Lovenox injections 60mg  BID and she has continued this regimen. Since then she had pains in her legs occured for 3 days and then went away. She denies bleeding from Lovenox and has occasional bruising at injection site. She sees her OBGYN every 2 weeks now and her expected due date is 3 months from now.   She had a C-section in 2017 from her first child birth. After the surgery she has several vaginal clots.  She notes after her first C-section had some anemia. She did not need iron replacements or blood transfusions.  She is considering a C-section again for this pregnancy.  She denies family history of blood clots or blood disorders.  She has a Gyn history of G3P1A1. She had a miscarriage in 2016 at 5 weeks. She denies significant history of smoking. 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 She denies having varicose veins before. In the past she was on oral contraceptives for a total of 5-10 year with no concern for VTE. She has been on Zoloft in the past for anxiety, but no longer on this medication.  On review of symptoms, pt notes having redness, itching and swelling around her  eyes recently. PCP and dermatologist  suspect her reactions to be from environmental allergies. She denies any significant diagnosis. She notes no CP or SOB. She denies abnormal leg swelling or discoloration.    INTERVAL HISTORY:   Kathy Tyler presents today for follow up of DVT during pregnancy (35 weeks). She is accompanied by her husband, Kathy Tyler. She is doing well overall. She denies any issues with the lovenox shots and she has been on this since September 24, 2017. She reports that her OB would like to place her on heparin prior to her repeat C-section on 01/06/2018. She is taking ferrous sulfate (ferosol) once daily and she is tolerating it well.   Pending labs as of 10/14/2017: Factor V Leiden, negative. Iron at 92, TIBC at 466, Saturation ratios at 20. Ferritin at 12. Lupus anticoagulant panel WNL. Beta-2-glycoprotein I abs, IgG/M/A WNL. Prothrombin gene mutation negative.   Discussed pt labwork today (12/12/17) of CBC, CMP, Reticulocytes, and Ferritin is as follows: all values are WNL except for Reticulocytes with retic ct pct at 2.7, RBC at 3.2. CMP with Sodium at 134, BUN at 5, Albumin at 2.6. CBC with WBC at 10.9, RBC at 3.2, Hgb at 10.5 (up from 8.2) , HCT at 30.7, Neutro abs at 7.9. Ferritin at 10.   She will be admitted to the hospital on 01/06/2018 for repeat caesarean section.   On review of systems, she denies any other symptoms.    MEDICAL  HISTORY:  Past Medical History:  Diagnosis Date  . Anxiety   . Mental disorder     SURGICAL HISTORY: Past Surgical History:  Procedure Laterality Date  . CESAREAN SECTION N/A 12/21/2015   Procedure: CESAREAN SECTION;  Surgeon: Zelphia Cairo, MD;  Location: [facility];  Service: Obstetrics;  Laterality: N/A;  . WISDOM TOOTH EXTRACTION  2007    SOCIAL HISTORY: Social History   Socioeconomic History  . Marital status: Married    Spouse name: Not on file  . Number of children: Not on file  . Years of education: Not on file  . Highest  education level: Not on file  Social Needs  . Financial resource strain: Not on file  . Food insecurity - worry: Not on file  . Food insecurity - inability: Not on file  . Transportation needs - medical: Not on file  . Transportation needs - non-medical: Not on file  Occupational History  . Not on file  Tobacco Use  . Smoking status: Never Smoker  . Smokeless tobacco: Never Used  Substance and Sexual Activity  . Alcohol use: No  . Drug use: No  . Sexual activity: Yes    Birth control/protection: None  Other Topics Concern  . Not on file  Social History Narrative  . Not on file    FAMILY HISTORY: History reviewed. No pertinent family history.  ALLERGIES:  is allergic to tobramycin.  MEDICATIONS:  Current Outpatient Medications  Medication Sig Dispense Refill  . desonide (DESOWEN) 0.05 % ointment Apply 1 application topically 2 (two) times a week.    . enoxaparin (LOVENOX) 60 MG/0.6ML injection Inject 60 mg into the skin every 12 (twelve) hours.    . Prenat w/o A-FeCbn-Meth-FA-DHA (PRENATE MINI PO) Take by mouth daily.     No current facility-administered medications for this visit.     REVIEW OF SYSTEMS:    .10 Point review of Systems was done is negative except as noted above.  PHYSICAL EXAMINATION: ECOG PERFORMANCE STATUS: 0 - Asymptomatic  . Vitals:   12/12/17 1109  BP: 101/64  Pulse: 83  Resp: 18  Temp: 97.9 F (36.6 C)  SpO2: 99%   Filed Weights   12/12/17 1109  Weight: 148 lb (67.1 kg)   .Body mass index is 27.07 kg/m. Marland Kitchen GENERAL:alert, in no acute distress and comfortable SKIN: no acute rashes, no significant lesions EYES: conjunctiva are pink and non-injected, sclera anicteric OROPHARYNX: MMM, no exudates, no oropharyngeal erythema or ulceration NECK: supple, no JVD LYMPH:  no palpable lymphadenopathy in the cervical, axillary or inguinal regions LUNGS: clear to auscultation b/l with normal respiratory effort HEART: regular rate &  rhythm ABDOMEN:  normoactive bowel sounds , non tender, not distended. Extremity: no pedal edema PSYCH: alert & oriented x 3 with fluent speech NEURO: no focal motor/sensory deficits   LABORATORY DATA:  I have reviewed the data as listed  . CBC Latest Ref Rng & Units 12/12/2017 12/23/2015 12/22/2015  WBC 3.9 - 10.3 K/uL 10.9(H) 18.0(H) 33.1(H)  Hemoglobin 11.6 - 15.9 g/dL 10.5(L) 8.2(L) 9.8(L)  Hematocrit 34.8 - 46.6 % 30.7(L) 23.5(L) 27.5(L)  Platelets 145 - 400 K/uL 273 198 233  HGB 10.5 . CMP Latest Ref Rng & Units 12/12/2017  Glucose 70 - 140 mg/dL 91  BUN 7 - 26 mg/dL 5(L)  Creatinine 1.61 - 1.10 mg/dL 0.96  Sodium 045 - 409 mmol/L 134(L)  Potassium 3.5 - 5.1 mmol/L 4.1  Chloride 98 - 109 mmol/L 104  CO2 22 -  29 mmol/L 24  Calcium 8.4 - 10.4 mg/dL 9.0  Total Protein 6.4 - 8.3 g/dL 6.5  Total Bilirubin 0.2 - 1.2 mg/dL 0.3  Alkaline Phos 40 - 150 U/L 119  AST 5 - 34 U/L 12  ALT 0 - 55 U/L 10   . Lab Results  Component Value Date   IRON 92 10/14/2017   TIBC 466 (H) 10/14/2017   IRONPCTSAT 20 (L) 10/14/2017   (Iron and TIBC)  Lab Results  Component Value Date   FERRITIN 10 12/12/2017     Component     Latest Ref Rng & Units 10/14/2017  Iron     41 - 142 ug/dL 92  TIBC     621 - 308 ug/dL 657 (H)  Saturation Ratios     21 - 57 % 20 (L)  UIBC     ug/dL 846  Anticardiolipin Ab,IgG,Qn     0 - 14 GPL U/mL <9  Anticardiolipin Ab,IgM,Qn     0 - 12 MPL U/mL 10  Anticardiolipin Ab,IgA,Qn     0 - 11 APL U/mL <9  PTT Lupus Anticoagulant     0.0 - 51.9 sec 33.8  DRVVT     0.0 - 47.0 sec 34.8  Lupus Anticoag Interp      Comment:  Beta-2 Glycoprotein I Ab, IgG     0 - 20 GPI IgG units <9  Beta-2-Glycoprotein I IgM     0 - 32 GPI IgM units <9  Beta-2-Glycoprotein I IgA     0 - 25 GPI IgA units <9  Ferritin     9 - 269 ng/mL 12    Lupus anticoagulant panel      The value has a corrected status.      No reference range information available      Resulting  Lab: [place]      Comments:           (NOTE)           No lupus anticoagulant was detected.   factor V Leiden mutation and prothrombin gene mutation - negative   RADIOGRAPHIC STUDIES: I have personally reviewed the radiological images as listed and agreed with the findings in the report. No results found.   [place] LOWER EXTREMITY VENOUS DUPLEX LEFT 09/25/17  FINDINGS: Grayscale, color, and pulse wave Doppler evaluation of the deep venous system of the left lower 70 was performed. There is nonocclusive thrombus seen within the proximal left calf within the tibial peroneal trunk which is incompletely compressible. All other veins of the deep venous system of the left lower extremity from the common femoral vein through the popliteal vein demonstrate compression augmentation, and spontaneous phasic venous flow. No evidence of deep venous thrombosis within the contralateral right common femoral vein. IMPRESSION: 1. Nonocclusive deep venous thrombosis left lower extremity within the tibial peroneal trunk (the proximal calf).   ASSESSMENT & PLAN:   Somaya Grassi is a 37 y.o. female with   1. Acute non occlusive no symptoms suggestive of pulmonary embolism DVT  left lower extremity within the tibial peroneal trunk (the proximal calf).- diagnosed at [redacted] weeks pregnant . -09/25/17 Doppler shows a below-the-knee non-occlusive blood clot is low risk.  -No symptoms suggestive of pulmonary embolism. -No previous personal history or family history of venous thromboembolism. Plan -I discussed that with no other provocation for her DVT, it is most likely provoked by her pregnancy.  -she is currently [redacted] weeks pregnant and notes resolution  of her LLE symptoms after being on Lovenox 60mg  [place] Q12h for the last 3 weeks. -Given her lack of personal or family history of blood clots genetic lab work up is up to the Pt. She is keen to r/o other potential trigger factors especially since she  has not had a previous hormonally triggered event with OCP or her 1st pregnancy.  - we did a limited hypercoag w/u which was neg for FVL and prothrombin gene mutation and APLA Ab neg. -Currently on Lovenox injections 60mg  BID given her pregnancy. I recommend continuing full dose regimen until labor -then for at least 6 weeks after delivery we will do preventive dose lovenox at 60mg  once daily starting 12-24h after delivery if no issues with bleeding..  -I previously recommended using compression socks and elevation of her feet when she sits.  -Pt is considering a planned C-section on 01/06/18. If she proceeds with C-section she will need to hold her lovenox 24h before surgery.  -I previously discussed if this is a hormonal triggered blood clot, I do not recommend oral contraception use in the future and it would be recommended to do preventive dose blood thinners if she became pregnant again in the future. -Will continue 6 weeks out from pregnancy delivery date to maintain the 6 months use of lovenox.  -Advised the patient and her husband to remain mindful of estrogen containing contraceptives to avoid potential DVT.  -In the event of another pregnancy, the patient would need to be placed on a preventive anticoagulant.  -Discussed the precautions to avoid a future DVT including but not limited to: wearing compression stockings on long travel and standing every hour for prolonged travel or flights.   2. Iron deficiency during pregnancy . Lab Results  Component Value Date   IRON 92 10/14/2017   TIBC 466 (H) 10/14/2017   IRONPCTSAT 20 (L) 10/14/2017   (Iron and TIBC)  Lab Results  Component Value Date   FERRITIN 10 12/12/2017   Plan -Previously recommended taking Iron polysaccharide 150mg  po daily in addition to Prenatal vitamins -she is taking ferosol (ferrous sulfate) prescription once daily.  Advised the patient and her husband that she would be able to increase her dose from once daily to  BID to increase her ferritin levels.  -f/u with PCP to optimize iron replacement post partum to maintain ferritin levels of close to 50.  RTC as needed with Dr Candise CheKale  All of the patients questions were answered with apparent satisfaction. The patient knows to call the clinic with any problems, questions or concerns.  . The total time spent in the appointment was 25 minutes and more than 50% was on counseling and direct patient cares.     Wyvonnia LoraGautam Tauna Macfarlane MD MS AAHIVMS [facility] Hematology/Oncology Physician [facility]  (Office):       [phone] (Work cell):  [phone] (Fax):           [phone]  12/12/2017 12:06 PM  This document serves as a record of services personally performed by Wyvonnia LoraGautam Portia Wisdom, MD. It was created on his behalf by Chestine SporeSoijett Blue, a trained medical scribe. The creation of this record is based on the scribe's personal observations and the provider's statements to them.    .I have reviewed the above documentation for accuracy and completeness, and I agree with the above. Johney Maine.Kidada Ging Kishore Neylan Koroma MD MS

## 2017-12-09 DIAGNOSIS — Z3685 Encounter for antenatal screening for Streptococcus B: Secondary | ICD-10-CM | POA: Diagnosis not present

## 2017-12-09 DIAGNOSIS — D649 Anemia, unspecified: Secondary | ICD-10-CM | POA: Diagnosis not present

## 2017-12-09 DIAGNOSIS — Z3480 Encounter for supervision of other normal pregnancy, unspecified trimester: Secondary | ICD-10-CM | POA: Diagnosis not present

## 2017-12-12 ENCOUNTER — Encounter: Payer: Self-pay | Admitting: Hematology

## 2017-12-12 ENCOUNTER — Inpatient Hospital Stay: Payer: BLUE CROSS/BLUE SHIELD

## 2017-12-12 ENCOUNTER — Inpatient Hospital Stay: Payer: BLUE CROSS/BLUE SHIELD | Attending: Hematology | Admitting: Hematology

## 2017-12-12 VITALS — BP 101/64 | HR 83 | Temp 97.9°F | Resp 18 | Ht 62.0 in | Wt 148.0 lb

## 2017-12-12 DIAGNOSIS — D509 Iron deficiency anemia, unspecified: Secondary | ICD-10-CM | POA: Diagnosis not present

## 2017-12-12 DIAGNOSIS — O99012 Anemia complicating pregnancy, second trimester: Secondary | ICD-10-CM

## 2017-12-12 DIAGNOSIS — I82409 Acute embolism and thrombosis of unspecified deep veins of unspecified lower extremity: Secondary | ICD-10-CM | POA: Insufficient documentation

## 2017-12-12 DIAGNOSIS — O2232 Deep phlebothrombosis in pregnancy, second trimester: Secondary | ICD-10-CM

## 2017-12-12 DIAGNOSIS — O2230 Deep phlebothrombosis in pregnancy, unspecified trimester: Secondary | ICD-10-CM

## 2017-12-12 LAB — CMP (CANCER CENTER ONLY)
ALT: 10 U/L (ref 0–55)
AST: 12 U/L (ref 5–34)
Albumin: 2.6 g/dL — ABNORMAL LOW (ref 3.5–5.0)
Alkaline Phosphatase: 119 U/L (ref 40–150)
Anion gap: 6 (ref 3–11)
BUN: 5 mg/dL — ABNORMAL LOW (ref 7–26)
CO2: 24 mmol/L (ref 22–29)
Calcium: 9 mg/dL (ref 8.4–10.4)
Chloride: 104 mmol/L (ref 98–109)
Creatinine: 0.63 mg/dL (ref 0.60–1.10)
GFR, Est AFR Am: >60 mL/min
GFR, Estimated: >60 mL/min
Glucose, Bld: 91 mg/dL (ref 70–140)
Potassium: 4.1 mmol/L (ref 3.5–5.1)
Sodium: 134 mmol/L — ABNORMAL LOW (ref 136–145)
Total Bilirubin: 0.3 mg/dL (ref 0.2–1.2)
Total Protein: 6.5 g/dL (ref 6.4–8.3)

## 2017-12-12 LAB — CBC WITH DIFFERENTIAL/PLATELET
Basophils Absolute: 0 10*3/uL (ref 0.0–0.1)
Basophils Relative: 0 %
Eosinophils Absolute: 0.1 10*3/uL (ref 0.0–0.5)
Eosinophils Relative: 1 %
HCT: 30.7 % — ABNORMAL LOW (ref 34.8–46.6)
Hemoglobin: 10.5 g/dL — ABNORMAL LOW (ref 11.6–15.9)
Lymphocytes Relative: 20 %
Lymphs Abs: 2.2 10*3/uL (ref 0.9–3.3)
MCH: 32.8 pg (ref 25.1–34.0)
MCHC: 34.2 g/dL (ref 31.5–36.0)
MCV: 95.9 fL (ref 79.5–101.0)
Monocytes Absolute: 0.7 10*3/uL (ref 0.1–0.9)
Monocytes Relative: 7 %
Neutro Abs: 7.9 10*3/uL — ABNORMAL HIGH (ref 1.5–6.5)
Neutrophils Relative %: 72 %
Platelets: 273 10*3/uL (ref 145–400)
RBC: 3.2 MIL/uL — ABNORMAL LOW (ref 3.70–5.45)
RDW: 13.9 % (ref 11.2–14.5)
WBC: 10.9 10*3/uL — ABNORMAL HIGH (ref 3.9–10.3)

## 2017-12-12 LAB — RETICULOCYTES
RBC.: 3.2 MIL/uL — ABNORMAL LOW (ref 3.70–5.45)
Retic Count, Absolute: 86.4 10*3/uL (ref 33.7–90.7)
Retic Ct Pct: 2.7 % — ABNORMAL HIGH (ref 0.7–2.1)

## 2017-12-12 LAB — FERRITIN: Ferritin: 10 ng/mL (ref 9–269)

## 2017-12-16 ENCOUNTER — Telehealth: Payer: Self-pay

## 2017-12-16 DIAGNOSIS — D509 Iron deficiency anemia, unspecified: Secondary | ICD-10-CM | POA: Diagnosis not present

## 2017-12-16 NOTE — Telephone Encounter (Signed)
RTC as needed with Dr Candise CheKale. Per 3/15 los

## 2017-12-22 ENCOUNTER — Encounter (HOSPITAL_COMMUNITY): Payer: Self-pay | Admitting: *Deleted

## 2017-12-23 DIAGNOSIS — I82409 Acute embolism and thrombosis of unspecified deep veins of unspecified lower extremity: Secondary | ICD-10-CM | POA: Diagnosis not present

## 2017-12-23 DIAGNOSIS — D649 Anemia, unspecified: Secondary | ICD-10-CM | POA: Diagnosis not present

## 2017-12-25 ENCOUNTER — Telehealth (HOSPITAL_COMMUNITY): Payer: Self-pay | Admitting: *Deleted

## 2017-12-25 NOTE — Telephone Encounter (Signed)
Preadmission screen  

## 2017-12-26 DIAGNOSIS — I82409 Acute embolism and thrombosis of unspecified deep veins of unspecified lower extremity: Secondary | ICD-10-CM | POA: Diagnosis not present

## 2017-12-29 ENCOUNTER — Encounter (HOSPITAL_COMMUNITY): Payer: Self-pay

## 2017-12-29 DIAGNOSIS — Z3480 Encounter for supervision of other normal pregnancy, unspecified trimester: Secondary | ICD-10-CM | POA: Diagnosis not present

## 2017-12-29 DIAGNOSIS — I82409 Acute embolism and thrombosis of unspecified deep veins of unspecified lower extremity: Secondary | ICD-10-CM | POA: Diagnosis not present

## 2017-12-29 DIAGNOSIS — Z3A38 38 weeks gestation of pregnancy: Secondary | ICD-10-CM | POA: Diagnosis not present

## 2017-12-29 NOTE — Pre-Procedure Instructions (Signed)
Discussed plan of care with Dr Bradley FerrisEllender.  Pt is to take her am dose of Heparin before 0700 on 4/8.  She is to be heparin free for at least 24 hours prior to surgery.  Called pt and informed of this plan with verbalized understanding.  A pt-INR and PTT order was placed to be drawn on the morning of surgery.

## 2017-12-31 NOTE — H&P (Addendum)
Kathy Tyler is a 37 y.o. female presenting for repeat c-section.  Pregnancy complicated by DVT diagnosed in 12/18.  Initially treated w/ lovenox - transitioned to unfractionated heparin - current dose is 22,000u [place] Q12 hrs.  No vb, ctx, pain.   OB History    Gravida  3   Para  1   Term  1   Preterm      AB  1   Living  1     SAB  1   TAB      Ectopic      Multiple  0   Live Births  1          Past Medical History:  Diagnosis Date  . Anemia   . Anxiety    general and pp  . Depression    pp anx and depression  . DVT (deep vein thrombosis) in pregnancy (HCC)    12/18  . Hypercholesterolemia   . Mental disorder    Past Surgical History:  Procedure Laterality Date  . CESAREAN SECTION N/A 12/21/2015   Procedure: CESAREAN SECTION;  Surgeon: Zelphia CairoGretchen Ellenora Talton, MD;  Location: [facility];  Service: Obstetrics;  Laterality: N/A;  . WISDOM TOOTH EXTRACTION  2007   Family History: family history includes Atrial fibrillation in her mother; Bladder Cancer in her maternal grandmother; Colon cancer in her maternal grandfather; Diabetes in her maternal grandmother; Heart attack in her paternal grandfather; Heart disease in her maternal grandmother and paternal grandfather; Hypertension in her paternal grandfather; Stroke in her maternal grandfather. Social History:  reports that she has never smoked. She has never used smokeless tobacco. She reports that she does not drink alcohol or use drugs.     Maternal Diabetes: No Genetic Screening: Normal Maternal Ultrasounds/Referrals: Normal Fetal Ultrasounds or other Referrals:  None Maternal Substance Abuse:  No Significant Maternal Medications:  None Significant Maternal Lab Results:  None Other Comments:  None  ROS History   Last menstrual period 04/07/2017, unknown if currently breastfeeding. Exam Physical Exam  Prenatal labs: ABO, Rh: A/Positive/-- (09/04 0000) Antibody: Negative (09/04 0000) Rubella: Immune (09/04  0000) RPR: Nonreactive (09/04 0000)  HBsAg: Negative (09/04 0000)  HIV: Non-reactive (09/04 0000)  GBS:   negative  Results for orders placed or performed during the hospital encounter of 01/06/18 (from the past 24 hour(s))  Prepare RBC (crossmatch)     Status: None   Collection Time: 01/06/18  5:42 AM  Result Value Ref Range   Order Confirmation      ORDER PROCESSED BY BLOOD BANK Performed at [facility], [address] [place], [place] [address]   Protime-INR     Status: None   Collection Time: 01/06/18  6:30 AM  Result Value Ref Range   Prothrombin Time 12.4 11.4 - 15.2 seconds   INR 0.93   APTT     Status: None   Collection Time: 01/06/18  6:30 AM  Result Value Ref Range   aPTT 27 24 - 36 seconds   Assessment/Plan: Previous c-section, desires repeat  DVT - hold heparin x 24 hours - check APTT prior to surgery/spinal Ancef R/b/a discussed, questions answered, informed consent  Zelphia CairoGretchen Fumi Guadron 12/31/2017, 8:49 AM

## 2018-01-01 DIAGNOSIS — Z3480 Encounter for supervision of other normal pregnancy, unspecified trimester: Secondary | ICD-10-CM | POA: Diagnosis not present

## 2018-01-01 DIAGNOSIS — D649 Anemia, unspecified: Secondary | ICD-10-CM | POA: Diagnosis not present

## 2018-01-01 DIAGNOSIS — I82409 Acute embolism and thrombosis of unspecified deep veins of unspecified lower extremity: Secondary | ICD-10-CM | POA: Diagnosis not present

## 2018-01-05 ENCOUNTER — Encounter (HOSPITAL_COMMUNITY)
Admission: RE | Admit: 2018-01-05 | Discharge: 2018-01-05 | Disposition: A | Discharge status: Home / Self Care | Payer: BLUE CROSS/BLUE SHIELD | Source: Ambulatory Visit | Attending: Obstetrics and Gynecology | Admitting: Obstetrics and Gynecology

## 2018-01-05 DIAGNOSIS — Z3A39 39 weeks gestation of pregnancy: Secondary | ICD-10-CM | POA: Diagnosis not present

## 2018-01-05 DIAGNOSIS — Z23 Encounter for immunization: Secondary | ICD-10-CM | POA: Diagnosis not present

## 2018-01-05 DIAGNOSIS — Z3A00 Weeks of gestation of pregnancy not specified: Secondary | ICD-10-CM | POA: Diagnosis not present

## 2018-01-05 DIAGNOSIS — O34219 Maternal care for unspecified type scar from previous cesarean delivery: Secondary | ICD-10-CM | POA: Diagnosis not present

## 2018-01-05 DIAGNOSIS — Z86718 Personal history of other venous thrombosis and embolism: Secondary | ICD-10-CM | POA: Diagnosis not present

## 2018-01-05 DIAGNOSIS — O34211 Maternal care for low transverse scar from previous cesarean delivery: Secondary | ICD-10-CM | POA: Diagnosis not present

## 2018-01-05 DIAGNOSIS — Z7901 Long term (current) use of anticoagulants: Secondary | ICD-10-CM | POA: Diagnosis not present

## 2018-01-05 HISTORY — DX: Anemia, unspecified: D64.9

## 2018-01-05 HISTORY — DX: Major depressive disorder, single episode, unspecified: F32.9

## 2018-01-05 HISTORY — DX: Deep phlebothrombosis in pregnancy, unspecified trimester: O22.30

## 2018-01-05 HISTORY — DX: Pure hypercholesterolemia, unspecified: E78.00

## 2018-01-05 HISTORY — DX: Depression, unspecified: F32.A

## 2018-01-05 LAB — CBC
HCT: 34.4 % — ABNORMAL LOW (ref 36.0–46.0)
Hemoglobin: 11.8 g/dL — ABNORMAL LOW (ref 12.0–15.0)
MCH: 31.9 pg (ref 26.0–34.0)
MCHC: 34.3 g/dL (ref 30.0–36.0)
MCV: 93 fL (ref 78.0–100.0)
Platelets: 311 10*3/uL (ref 150–400)
RBC: 3.7 MIL/uL — ABNORMAL LOW (ref 3.87–5.11)
RDW: 14.1 % (ref 11.5–15.5)
WBC: 10.5 10*3/uL (ref 4.0–10.5)

## 2018-01-05 NOTE — Anesthesia Preprocedure Evaluation (Signed)
Anesthesia Evaluation  Patient identified by MRN, date of birth, ID band Patient awake    Reviewed: Allergy & Precautions, H&P , Patient's Chart, lab work & pertinent test results, reviewed documented beta blocker date and time   Airway Mallampati: II  TM Distance: >3 FB Neck ROM: full    Dental no notable dental hx.    Pulmonary    Pulmonary exam normal breath sounds clear to auscultation       Cardiovascular  Rhythm:regular Rate:Normal     Neuro/Psych    GI/Hepatic   Endo/Other    Renal/GU      Musculoskeletal   Abdominal   Peds  Hematology   Anesthesia Other Findings Heparin- PTT today before surg  Reproductive/Obstetrics                             Anesthesia Physical Anesthesia Plan  ASA: II  Anesthesia Plan: Spinal   Post-op Pain Management:    Induction:   PONV Risk Score and Plan:   Airway Management Planned:   Additional Equipment:   Intra-op Plan:   Post-operative Plan:   Informed Consent: I have reviewed the patients History and Physical, chart, labs and discussed the procedure including the risks, benefits and alternatives for the proposed anesthesia with the patient or authorized representative who has indicated his/her understanding and acceptance.   Dental Advisory Given  Plan Discussed with: CRNA and Surgeon  Anesthesia Plan Comments: (  )        Anesthesia Quick Evaluation

## 2018-01-05 NOTE — Patient Instructions (Signed)
Leonia Readerbbie Vacha  01/05/2018   Your procedure is scheduled on:  01/06/2018  Enter through the Main Entrance of [facility] at 0530 AM.  Pick up the phone at the desk and dial [phone]  Call this number if you have problems the morning of [phone]  Remember:   Do not eat food:(After Midnight) Desps de medianoche.  Do not drink clear liquids: (After Midnight) Desps de medianoche.  Take these medicines the morning of surgery with A SIP OF WATER: do not take any heparin the night before or morning of surgery   Do not wear jewelry, make-up or nail polish.  Do not wear lotions, powders, or perfumes. Do not wear deodorant.  Do not shave 48 hours prior to surgery.  Do not bring valuables to the hospital.  [facility] is not   responsible for any belongings or valuables brought to the hospital.  Contacts, dentures or bridgework may not be worn into surgery.  Leave suitcase in the car. After surgery it may be brought to your room.  For patients admitted to the hospital, checkout time is 11:00 AM the day of              discharge.    N/A   Please read over the following fact sheets that you were given:   Surgical Site Infection Prevention

## 2018-01-06 ENCOUNTER — Encounter (HOSPITAL_COMMUNITY)
Admission: AD | Disposition: A | Discharge status: Home / Self Care | Payer: Self-pay | Source: Ambulatory Visit | Attending: Obstetrics and Gynecology

## 2018-01-06 ENCOUNTER — Inpatient Hospital Stay (HOSPITAL_COMMUNITY): Payer: BLUE CROSS/BLUE SHIELD | Admitting: Anesthesiology

## 2018-01-06 ENCOUNTER — Other Ambulatory Visit: Payer: Self-pay

## 2018-01-06 ENCOUNTER — Inpatient Hospital Stay (HOSPITAL_COMMUNITY)
Admission: AD | Admit: 2018-01-06 | Discharge: 2018-01-08 | DRG: 788 | Disposition: A | Discharge status: Home / Self Care | Payer: BLUE CROSS/BLUE SHIELD | Source: Ambulatory Visit | Attending: Obstetrics and Gynecology | Admitting: Obstetrics and Gynecology

## 2018-01-06 ENCOUNTER — Encounter (HOSPITAL_COMMUNITY): Payer: Self-pay

## 2018-01-06 DIAGNOSIS — Z7901 Long term (current) use of anticoagulants: Secondary | ICD-10-CM | POA: Diagnosis not present

## 2018-01-06 DIAGNOSIS — Z3A00 Weeks of gestation of pregnancy not specified: Secondary | ICD-10-CM | POA: Diagnosis not present

## 2018-01-06 DIAGNOSIS — O34211 Maternal care for low transverse scar from previous cesarean delivery: Principal | ICD-10-CM | POA: Diagnosis present

## 2018-01-06 DIAGNOSIS — O34219 Maternal care for unspecified type scar from previous cesarean delivery: Secondary | ICD-10-CM | POA: Diagnosis not present

## 2018-01-06 DIAGNOSIS — Z86718 Personal history of other venous thrombosis and embolism: Secondary | ICD-10-CM

## 2018-01-06 DIAGNOSIS — Z3A39 39 weeks gestation of pregnancy: Secondary | ICD-10-CM | POA: Diagnosis not present

## 2018-01-06 DIAGNOSIS — Z98891 History of uterine scar from previous surgery: Secondary | ICD-10-CM

## 2018-01-06 LAB — APTT: aPTT: 27 seconds (ref 24–36)

## 2018-01-06 LAB — PROTIME-INR
INR: 0.93
Prothrombin Time: 12.4 seconds (ref 11.4–15.2)

## 2018-01-06 LAB — PREPARE RBC (CROSSMATCH)

## 2018-01-06 SURGERY — Surgical Case
Anesthesia: Spinal

## 2018-01-06 MED ORDER — WITCH HAZEL-GLYCERIN EX PADS: 1.0000 "application " | MEDICATED_PAD | CUTANEOUS | Status: DC | PRN

## 2018-01-06 MED ORDER — FENTANYL CITRATE (PF) 100 MCG/2ML IJ SOLN
INTRAMUSCULAR | Status: DC | PRN
  Administered 2018-01-06: 25 ug via INTRATHECAL

## 2018-01-06 MED ORDER — SIMETHICONE 80 MG PO CHEW
80.0000 mg | CHEWABLE_TABLET | Freq: Three times a day (TID) | ORAL | Status: DC
  Administered 2018-01-06 – 2018-01-08 (×5): 80 mg via ORAL
  Filled 2018-01-06 (×5): qty 1

## 2018-01-06 MED ORDER — OXYTOCIN 40 UNITS IN LACTATED RINGERS INFUSION - SIMPLE MED: 2.5000 [IU]/h | INTRAVENOUS | Status: AC

## 2018-01-06 MED ORDER — CEFAZOLIN SODIUM-DEXTROSE 2-4 GM/100ML-% IV SOLN
2.0000 g | INTRAVENOUS | Status: AC
  Administered 2018-01-06: 2 g via INTRAVENOUS
  Filled 2018-01-06: qty 100

## 2018-01-06 MED ORDER — LACTATED RINGERS IV SOLN
INTRAVENOUS | Status: DC
  Administered 2018-01-06 (×3): via INTRAVENOUS

## 2018-01-06 MED ORDER — PRENATAL MULTIVITAMIN CH
1.0000 | ORAL_TABLET | Freq: Every day | ORAL | Status: DC
  Administered 2018-01-07: 1 via ORAL
  Filled 2018-01-06: qty 1

## 2018-01-06 MED ORDER — OXYCODONE-ACETAMINOPHEN 5-325 MG PO TABS: 2.0000 | ORAL_TABLET | ORAL | Status: DC | PRN

## 2018-01-06 MED ORDER — SENNOSIDES-DOCUSATE SODIUM 8.6-50 MG PO TABS
2.0000 | ORAL_TABLET | ORAL | Status: DC
  Administered 2018-01-06 – 2018-01-08 (×2): 2 via ORAL
  Filled 2018-01-06 (×2): qty 2

## 2018-01-06 MED ORDER — MEDROXYPROGESTERONE ACETATE 150 MG/ML IM SUSP: 150.0000 mg | INTRAMUSCULAR | Status: DC | PRN

## 2018-01-06 MED ORDER — GLYCOPYRROLATE 0.2 MG/ML IJ SOLN
INTRAMUSCULAR | Status: DC | PRN
  Administered 2018-01-06: 0.1 mg via INTRAVENOUS

## 2018-01-06 MED ORDER — DIPHENHYDRAMINE HCL 25 MG PO CAPS: 25.0000 mg | ORAL_CAPSULE | Freq: Four times a day (QID) | ORAL | Status: DC | PRN

## 2018-01-06 MED ORDER — IBUPROFEN 600 MG PO TABS
600.0000 mg | ORAL_TABLET | Freq: Four times a day (QID) | ORAL | Status: DC
  Administered 2018-01-06 – 2018-01-08 (×7): 600 mg via ORAL
  Filled 2018-01-06 (×7): qty 1

## 2018-01-06 MED ORDER — PHENYLEPHRINE 8 MG IN D5W 100 ML (0.08MG/ML) PREMIX OPTIME
INJECTION | INTRAVENOUS | Status: AC
  Filled 2018-01-06: qty 100

## 2018-01-06 MED ORDER — PHENYLEPHRINE 40 MCG/ML (10ML) SYRINGE FOR IV PUSH (FOR BLOOD PRESSURE SUPPORT)
PREFILLED_SYRINGE | INTRAVENOUS | Status: AC
  Filled 2018-01-06: qty 10

## 2018-01-06 MED ORDER — DEXTROSE IN LACTATED RINGERS 5 % IV SOLN
INTRAVENOUS | Status: DC
  Administered 2018-01-06 (×2): via INTRAVENOUS

## 2018-01-06 MED ORDER — GLYCOPYRROLATE 0.2 MG/ML IJ SOLN
INTRAMUSCULAR | Status: AC
  Filled 2018-01-06: qty 1

## 2018-01-06 MED ORDER — ONDANSETRON HCL 4 MG/2ML IJ SOLN
INTRAMUSCULAR | Status: AC
  Filled 2018-01-06: qty 2

## 2018-01-06 MED ORDER — SODIUM CHLORIDE 0.9 % IR SOLN
Status: DC | PRN
  Administered 2018-01-06 (×2): 1

## 2018-01-06 MED ORDER — OXYCODONE-ACETAMINOPHEN 5-325 MG PO TABS
1.0000 | ORAL_TABLET | ORAL | Status: DC | PRN
  Administered 2018-01-07 – 2018-01-08 (×4): 1 via ORAL
  Filled 2018-01-06 (×4): qty 1

## 2018-01-06 MED ORDER — PHENYLEPHRINE 8 MG IN D5W 100 ML (0.08MG/ML) PREMIX OPTIME
INJECTION | INTRAVENOUS | Status: DC | PRN
  Administered 2018-01-06: 60 ug/min via INTRAVENOUS

## 2018-01-06 MED ORDER — FENTANYL CITRATE (PF) 100 MCG/2ML IJ SOLN: 25.0000 ug | INTRAMUSCULAR | Status: DC | PRN

## 2018-01-06 MED ORDER — ENOXAPARIN SODIUM 80 MG/0.8ML ~~LOC~~ SOLN
70.0000 mg | Freq: Two times a day (BID) | SUBCUTANEOUS | Status: DC
  Filled 2018-01-06: qty 0.8

## 2018-01-06 MED ORDER — OXYTOCIN 10 UNIT/ML IJ SOLN
INTRAVENOUS | Status: DC | PRN
  Administered 2018-01-06: 40 [IU] via INTRAVENOUS

## 2018-01-06 MED ORDER — TETANUS-DIPHTH-ACELL PERTUSSIS 5-2.5-18.5 LF-MCG/0.5 IM SUSP: 0.5000 mL | Freq: Once | INTRAMUSCULAR | Status: DC

## 2018-01-06 MED ORDER — SIMETHICONE 80 MG PO CHEW
80.0000 mg | CHEWABLE_TABLET | ORAL | Status: DC
  Administered 2018-01-06 – 2018-01-08 (×2): 80 mg via ORAL
  Filled 2018-01-06 (×2): qty 1

## 2018-01-06 MED ORDER — ONDANSETRON HCL 4 MG/2ML IJ SOLN
INTRAMUSCULAR | Status: DC | PRN
  Administered 2018-01-06: 4 mg via INTRAVENOUS

## 2018-01-06 MED ORDER — SCOPOLAMINE 1 MG/3DAYS TD PT72
MEDICATED_PATCH | TRANSDERMAL | Status: AC
  Filled 2018-01-06: qty 1

## 2018-01-06 MED ORDER — ACETAMINOPHEN 325 MG PO TABS
650.0000 mg | ORAL_TABLET | ORAL | Status: DC | PRN
  Administered 2018-01-06: 650 mg via ORAL
  Filled 2018-01-06: qty 2

## 2018-01-06 MED ORDER — OXYTOCIN 10 UNIT/ML IJ SOLN
INTRAMUSCULAR | Status: AC
  Filled 2018-01-06: qty 4

## 2018-01-06 MED ORDER — MORPHINE SULFATE (PF) 0.5 MG/ML IJ SOLN
INTRAMUSCULAR | Status: AC
  Filled 2018-01-06: qty 10

## 2018-01-06 MED ORDER — PHENYLEPHRINE HCL 10 MG/ML IJ SOLN
INTRAMUSCULAR | Status: DC | PRN
  Administered 2018-01-06 (×2): 40 ug via INTRAVENOUS
  Administered 2018-01-06: 80 ug via INTRAVENOUS
  Administered 2018-01-06 (×2): 40 ug via INTRAVENOUS
  Administered 2018-01-06: 80 ug via INTRAVENOUS

## 2018-01-06 MED ORDER — SIMETHICONE 80 MG PO CHEW
80.0000 mg | CHEWABLE_TABLET | ORAL | Status: DC | PRN
  Administered 2018-01-06: 80 mg via ORAL
  Filled 2018-01-06: qty 1

## 2018-01-06 MED ORDER — ACETAMINOPHEN 160 MG/5ML PO SOLN
975.0000 mg | Freq: Once | ORAL | Status: AC
  Administered 2018-01-06: 975 mg via ORAL
  Filled 2018-01-06: qty 40.6

## 2018-01-06 MED ORDER — FENTANYL CITRATE (PF) 100 MCG/2ML IJ SOLN
INTRAMUSCULAR | Status: AC
  Filled 2018-01-06: qty 2

## 2018-01-06 MED ORDER — SCOPOLAMINE 1 MG/3DAYS TD PT72
MEDICATED_PATCH | TRANSDERMAL | Status: DC | PRN
  Administered 2018-01-06: 1 via TRANSDERMAL

## 2018-01-06 MED ORDER — LACTATED RINGERS IV SOLN
INTRAVENOUS | Status: DC | PRN
  Administered 2018-01-06: 08:00:00 via INTRAVENOUS

## 2018-01-06 MED ORDER — DIBUCAINE 1 % RE OINT: 1.0000 "application " | TOPICAL_OINTMENT | RECTAL | Status: DC | PRN

## 2018-01-06 MED ORDER — MENTHOL 3 MG MT LOZG: 1.0000 | LOZENGE | OROMUCOSAL | Status: DC | PRN

## 2018-01-06 MED ORDER — COCONUT OIL OIL
1.0000 "application " | TOPICAL_OIL | Status: DC | PRN
  Administered 2018-01-07: 1 via TOPICAL
  Filled 2018-01-06: qty 120

## 2018-01-06 MED ORDER — MORPHINE SULFATE (PF) 0.5 MG/ML IJ SOLN
INTRAMUSCULAR | Status: DC | PRN
  Administered 2018-01-06: .1 mg via INTRATHECAL

## 2018-01-06 SURGICAL SUPPLY — 28 items
CHLORAPREP W/TINT 26ML (MISCELLANEOUS) ×2 IMPLANT
CLAMP CORD UMBIL (MISCELLANEOUS) IMPLANT
CLOTH BEACON ORANGE TIMEOUT ST (SAFETY) ×2 IMPLANT
DERMABOND ADVANCED (GAUZE/BANDAGES/DRESSINGS) ×1
DERMABOND ADVANCED .7 DNX12 (GAUZE/BANDAGES/DRESSINGS) ×1 IMPLANT
DRSG OPSITE POSTOP 4X10 (GAUZE/BANDAGES/DRESSINGS) ×2 IMPLANT
ELECT REM PT RETURN 9FT ADLT (ELECTROSURGICAL) ×2
ELECTRODE REM PT RTRN 9FT ADLT (ELECTROSURGICAL) ×1 IMPLANT
EXTRACTOR VACUUM M CUP 4 TUBE (SUCTIONS) IMPLANT
GLOVE BIO SURGEON STRL SZ 6.5 (GLOVE) ×2 IMPLANT
GLOVE BIOGEL PI IND STRL 7.0 (GLOVE) ×2 IMPLANT
GLOVE BIOGEL PI INDICATOR 7.0 (GLOVE) ×2
GOWN STRL REUS W/TWL LRG LVL3 (GOWN DISPOSABLE) ×4 IMPLANT
KIT ABG SYR 3ML LUER SLIP (SYRINGE) IMPLANT
NEEDLE HYPO 25X5/8 SAFETYGLIDE (NEEDLE) IMPLANT
NS IRRIG 1000ML POUR BTL (IV SOLUTION) ×2 IMPLANT
PACK C SECTION WH (CUSTOM PROCEDURE TRAY) ×2 IMPLANT
PAD OB MATERNITY 4.3X12.25 (PERSONAL CARE ITEMS) ×2 IMPLANT
PENCIL SMOKE EVAC W/HOLSTER (ELECTROSURGICAL) ×2 IMPLANT
SUT CHROMIC 0 CT 802H (SUTURE) IMPLANT
SUT CHROMIC 0 CTX 36 (SUTURE) ×6 IMPLANT
SUT MON AB-0 CT1 36 (SUTURE) ×2 IMPLANT
SUT PDS AB 0 CTX 60 (SUTURE) ×2 IMPLANT
SUT PLAIN 0 NONE (SUTURE) IMPLANT
SUT VIC AB 4-0 KS 27 (SUTURE) IMPLANT
SYR BULB 3OZ (MISCELLANEOUS) ×2 IMPLANT
TOWEL OR 17X24 6PK STRL BLUE (TOWEL DISPOSABLE) ×2 IMPLANT
TRAY FOLEY BAG SILVER LF 14FR (SET/KITS/TRAYS/PACK) IMPLANT

## 2018-01-06 NOTE — Lactation Note (Signed)
This note was copied from a baby's chart. Lactation Consultation Note  Patient Name: Kathy Tyler ZOXWR'UToday's Date: 01/06/2018 Reason for consult: Initial assessment;Term   Initial assessment with Exp BF mom of 5 hour old infant. Infant STS with dad and sleeping. Infant with 4 BF for 16-35 minutes, 1 BF attempt, and 1 void since birth. Infant weight 8 pounds 8 ounces. LATCH scores 8.   Mom reports infant likes to feed. Mom reports some tenderness with latch that improves with feeding. Enc mom to feed infant STS 8-12 x in 24 hours at first feeding cues. Enc mom to offer both breasts with each feeding as infant wants. Enc mom to keep infant awake at breast as needed and to massage/compress breast with feeding. Mom reports she knows how to hand express. Enc mom to hand express before each feeding to get milk flowing and post BF to apply EBM to nipples.   BF Resources handout and LC Brochure given, mom informed of IP/OP Services, BF Support Groups and LC phone #. Mom has Medela pump at home for use. Mom reports she has no questions/concerns at this time. Mom declined need for [facility] assistance at this time. Mom to call out for feeding assistance as needed.    Maternal Data Formula Feeding for Exclusion: No Has patient been taught Hand Expression?: Yes Does the patient have breastfeeding experience prior to this delivery?: Yes  Feeding Feeding Type: Breast Fed Length of feed: 35 min  LATCH Score                   Interventions Interventions: Breast feeding basics reviewed;Support pillows;Skin to skin;Breast massage;Breast compression;Hand express  Lactation Tools Discussed/Used WIC Program: No   Consult Status Consult Status: Follow-up Date: 01/07/18 Follow-up type: In-patient    Silas FloodSharon S Hice 01/06/2018, 1:50 PM

## 2018-01-06 NOTE — Anesthesia Procedure Notes (Signed)
Spinal  Patient location during procedure: OR Staffing Anesthesiologist: Cristela BlueJackson, Ebrima Ranta, MD Spinal Block Patient position: sitting Prep: DuraPrep Patient monitoring: heart rate, blood pressure and continuous pulse ox Approach: right paramedian Location: L3-4 Injection technique: single-shot Needle Needle type: Sprotte  Needle gauge: 24 G Needle length: 9 cm Assessment Sensory level: T4 Additional Notes Spinal Dosage in OR  .75% Bupivicaine ml       1.3     [id number]   mcg        100    Fentanyl mcg            25   Level T2; perceived dyspnea but excellent grip strength; sx better with improved BP

## 2018-01-06 NOTE — Plan of Care (Signed)
Patient progressing appropriately post-op. Ambulation x2. Will continue to monitor.

## 2018-01-06 NOTE — Op Note (Signed)
Cesarean Section Procedure Note   Kathy Readerbbie Lanzo  01/06/2018  Indications: Scheduled Proceedure/Maternal Request   Pre-operative Diagnosis: previous X 1.   Post-operative Diagnosis: Same   Surgeon: Surgeon(s) and Role:    Zelphia Cairo* Shriley Joffe, MD - Primary   Assistants: Mamie Leversracy Tucker, RNFA  Anesthesia: spinal   Procedure Details:  The patient was seen in the Holding Room. The risks, benefits, complications, treatment options, and expected outcomes were discussed with the patient. The patient concurred with the proposed plan, giving informed consent. identified as Kathy Tyler and the procedure verified as C-Section Delivery. A Time Out was held and the above information confirmed.  After induction of anesthesia, the patient was draped and prepped in the usual sterile manner. A transverse was made and carried down through the subcutaneous tissue to the fascia. Fascial incision was made and extended transversely. The fascia was separated from the underlying rectus tissue superiorly and inferiorly. The peritoneum was identified and entered. Peritoneal incision was extended longitudinally. The utero-vesical peritoneal reflection was incised transversely and the bladder flap was bluntly freed from the lower uterine segment. A low transverse uterine incision was made. Delivered from cephalic presentation was a viable female infant.  Cord ph was not sent the umbilical cord was clamped and cut cord blood was obtained for evaluation. The placenta was removed Intact and appeared normal. The uterine outline, tubes and ovaries appeared normal}. The uterine incision was closed with running locked sutures of 0chromic gut.   Hemostasis was observed. Lavage was carried out until clear. The fascia was then reapproximated with running sutures of 0PDS.  The skin was closed with 4-0Vicryl.   Instrument, sponge, and needle counts were correct prior the abdominal closure and were correct at the conclusion of the case.     Findings:   Estimated Blood Loss: 778 mL   Urine Output: clear  Specimens: placenta for disposal   Complications: no complications  Disposition: PACU - hemodynamically stable.   Maternal Condition: stable   Baby condition / location:  Couplet care / Skin to Skin  Attending Attestation: I was present and scrubbed for the entire procedure.   Signed: Surgeon(s): Zelphia CairoAdkins, Daymen Hassebrock, MD

## 2018-01-06 NOTE — Transfer of Care (Signed)
Immediate Anesthesia Transfer of Care Note  Patient: Kathy Tyler  Procedure(s) Performed: REPEAT CESAREAN SECTION (N/A )  Patient Location: PACU  Anesthesia Type:Spinal  Level of Consciousness: awake, alert  and oriented  Airway & Oxygen Therapy: Patient Spontanous Breathing  Post-op Assessment: Report given to RN and Post -op Vital signs reviewed and stable  Post vital signs: Reviewed and stable  Last Vitals:  Vitals Value Taken Time  BP 86/47 01/06/2018  8:32 AM  Temp    Pulse 75 01/06/2018  8:34 AM  Resp 12 01/06/2018  8:34 AM  SpO2 96 % 01/06/2018  8:34 AM  Vitals shown include unvalidated device data.  Last Pain:  Vitals:   01/06/18 0601  TempSrc: Oral         Complications: No apparent anesthesia complications

## 2018-01-07 ENCOUNTER — Encounter (HOSPITAL_COMMUNITY): Payer: Self-pay | Admitting: *Deleted

## 2018-01-07 LAB — CBC
HCT: 25.7 % — ABNORMAL LOW (ref 36.0–46.0)
Hemoglobin: 8.7 g/dL — ABNORMAL LOW (ref 12.0–15.0)
MCH: 31.5 pg (ref 26.0–34.0)
MCHC: 33.9 g/dL (ref 30.0–36.0)
MCV: 93.1 fL (ref 78.0–100.0)
Platelets: 186 10*3/uL (ref 150–400)
RBC: 2.76 MIL/uL — ABNORMAL LOW (ref 3.87–5.11)
RDW: 14.5 % (ref 11.5–15.5)
WBC: 11.7 10*3/uL — ABNORMAL HIGH (ref 4.0–10.5)

## 2018-01-07 LAB — RPR: RPR Ser Ql: NONREACTIVE

## 2018-01-07 LAB — BIRTH TISSUE RECOVERY COLLECTION (PLACENTA DONATION)

## 2018-01-07 MED ORDER — ENOXAPARIN SODIUM 80 MG/0.8ML ~~LOC~~ SOLN: 70.0000 mg | Freq: Two times a day (BID) | SUBCUTANEOUS | Status: DC

## 2018-01-07 MED ORDER — ENOXAPARIN SODIUM 60 MG/0.6ML ~~LOC~~ SOLN
60.0000 mg | Freq: Two times a day (BID) | SUBCUTANEOUS | Status: DC
  Administered 2018-01-07 – 2018-01-08 (×2): 60 mg via SUBCUTANEOUS
  Filled 2018-01-07 (×4): qty 0.6

## 2018-01-07 MED ORDER — FERROUS SULFATE 325 (65 FE) MG PO TABS
325.0000 mg | ORAL_TABLET | Freq: Every day | ORAL | Status: DC
  Administered 2018-01-07 – 2018-01-08 (×2): 325 mg via ORAL
  Filled 2018-01-07 (×2): qty 1

## 2018-01-07 NOTE — Progress Notes (Signed)
Subjective: Postpartum Day 1 Cesarean Delivery Patient reports tolerating PO and no problems voiding.  Pt ambulating Denies lightheadedness, dizziness.  No leg pain, CP, or SOB Pt reports moderate lochia with large clot this morning   Objective: Vital signs in last 24 hours: Temp:  [97.6 F (36.4 C)-98.4 F (36.9 C)] 98 F (36.7 C) (04/10 0510) Pulse Rate:  [55-73] 55 (04/10 0510) Resp:  [11-21] 16 (04/10 0510) BP: (81-100)/(44-61) 88/44 (04/10 0510) SpO2:  [96 %-100 %] 100 % (04/10 0510) Weight:  [139 lb (63 kg)] 139 lb (63 kg) (04/10 0830)  Physical Exam:  General: alert and cooperative Lochia: appropriate Uterine Fundus: firm Incision: healing well, no significant drainage DVT Evaluation: No evidence of DVT seen on physical exam.  Recent Labs    01/05/18 1008 01/07/18 0610  HGB 11.8* 8.7*  HCT 34.4* 25.7*    Assessment/Plan: Status post Cesarean section. Doing well postoperatively.  Continue current care. Plan to restart lovenox this evening - SCD and ambulation encouraged Recheck CBC in am  Kathy CairoGretchen Mauri Tyler 01/07/2018, 8:33 AM

## 2018-01-07 NOTE — Anesthesia Postprocedure Evaluation (Signed)
Anesthesia Post Note  Patient: Kathy Tyler  Procedure(s) Performed: REPEAT CESAREAN SECTION (N/A )     Patient location during evaluation: PACU Anesthesia Type: Spinal Level of consciousness: awake Pain management: satisfactory to patient Vital Signs Assessment: post-procedure vital signs reviewed and stable Respiratory status: spontaneous breathing Cardiovascular status: blood pressure returned to baseline Postop Assessment: no headache and spinal receding Anesthetic complications: no    Last Vitals:  Vitals:   01/07/18 0125 01/07/18 0510  BP: (!) 89/52 (!) 88/44  Pulse: 60 (!) 55  Resp: 16 16  Temp: 36.7 C 36.7 C  SpO2: 99% 100%    Last Pain:  Vitals:   01/07/18 0620  TempSrc:   PainSc: 1                  Shontel Santee EDWARD

## 2018-01-07 NOTE — Progress Notes (Signed)
CSW received consult for hx of Anxiety.  CSW met with [name] to offer support and complete assessment.    When CSW arrived, [name] was resting in bed, infant was asleep in bassinet, and [name]'s mother was watching TV on the couch. CSW explained CSW's role and [name] gave CSW permission to meeting with [name] while [name]'s mother (Bobby Wood) was present. [name] was polite, honest, and receptive to meeting with CSW.   CSW asked about [name]'s MH hx and [name] shared that [name] was dx with anxiety I her 20's and symptoms have been managed by medication. [name] reported taking medication until positive UPT in 2017 with [name]'s oldest child. [name] shared that [name] did well until after giving birth.  [name] described having increased anxiety symptoms and decided to reach out to [name]'s OB provider for medication management.  CSW praised [name] for having insight and awareness. [name] was prescribed Zoloft and symptoms decreased.   CSW provided education regarding the baby blues period vs. perinatal mood disorders, discussed treatment and gave resources for mental health follow up if concerns arise.  CSW recommends self-evaluation during the postpartum time period using the New Mom Checklist from Postpartum Progress and encouraged [name] to contact a medical professional if symptoms are noted at any time. CSW assessed for safety and [name] denied SI and HI.  [name] reports having a good support team and feeling prepared to parent. CSW offered [name] resources for outpatient counseling and [name] declined.   CSW identifies no further need for intervention and no barriers to discharge at this time.  Deniel Mcquiston Boyd-Gilyard, MSW, LCSW Clinical Social Work [phone]  

## 2018-01-07 NOTE — Lactation Note (Signed)
This note was copied from a baby's chart. Lactation Consultation Note  Patient Name: Kathy Tyler ZOXWR'UToday's Date: 01/07/2018 Reason for consult: Follow-up assessment;Nipple pain/trauma   Follow up with mom of 30 hour old infant. Infant with 8 BF for 10-45 minutes, 1 BF attempt, 3 voids and 2 stools in the last 24 hours. Infant weight 8 pounds 2.3 ounces with weight loss of 4% since birth. LATCH scores 9.   Mom reports BF is going well. She reports she has difficulty latching to one breast but it is getting better. Mom reports nipple tenderness with initial latch that improves with feeding. Mom asked if OK to use Coconut oil, gave her Coconut oil. Enc mom to apply EBM to nipples and follow with coconut oil.   Mom reports she has no other questions/concerns at this time. Mom to call out for feeding assistance as needed.    Maternal Data Formula Feeding for Exclusion: No Has patient been taught Hand Expression?: Yes Does the patient have breastfeeding experience prior to this delivery?: Yes  Feeding    LATCH Score                   Interventions Interventions: Coconut oil  Lactation Tools Discussed/Used     Consult Status Consult Status: Follow-up Date: 01/08/18 Follow-up type: In-patient    Silas FloodSharon S Ronzell Laban 01/07/2018, 2:49 PM

## 2018-01-08 MED ORDER — FERROUS SULFATE 325 (65 FE) MG PO TABS: 325.0000 mg | ORAL_TABLET | Freq: Every day | ORAL | 3 refills | Status: DC

## 2018-01-08 MED ORDER — OXYCODONE-ACETAMINOPHEN 5-325 MG PO TABS: 1.0000 | ORAL_TABLET | ORAL | 0 refills | Status: DC | PRN

## 2018-01-08 MED ORDER — IBUPROFEN 600 MG PO TABS: 600.0000 mg | ORAL_TABLET | Freq: Four times a day (QID) | ORAL | 0 refills | Status: DC

## 2018-01-08 MED ORDER — ENOXAPARIN SODIUM 60 MG/0.6ML ~~LOC~~ SOLN: 60.0000 mg | Freq: Two times a day (BID) | SUBCUTANEOUS | 2 refills | Status: DC

## 2018-01-08 NOTE — Discharge Summary (Signed)
Obstetric Discharge Summary Reason for Admission: cesarean section Prenatal Procedures: none Intrapartum Procedures: cesarean: low cervical, transverse Postpartum Procedures: none Complications-Operative and Postpartum: none Hemoglobin  Date Value Ref Range Status  01/07/2018 8.7 (L) 12.0 - 15.0 g/dL Final    Comment:    REPEATED TO VERIFY DELTA CHECK NOTED    HCT  Date Value Ref Range Status  01/07/2018 25.7 (L) 36.0 - 46.0 % Final    Physical Exam:  General: alert Lochia: appropriate Uterine Fundus: firm Incision: healing well DVT Evaluation: No evidence of DVT seen on physical exam.  Discharge Diagnoses: Term Pregnancy-delivered  Discharge Information: Date: 01/08/2018 Activity: pelvic rest Diet: routine Medications: PNV, Ibuprofen, Percocet and LOVENOX Condition: stable Instructions: refer to practice specific booklet Discharge to: home Follow-up Information    [facility], [facility] Schedule an appointment as soon as possible for a visit in 1 week(s).   Contact information: [address] [place] [address] [phone]           Newborn Data: Live born female  Birth Weight: 8 lb 8 oz (3855 g) APGAR: 8, 9  Newborn Delivery   Birth date/time:  [birth year]/2018 07:48:00 Delivery type:  C-Section, Low Transverse Trial of labor:  No C-section categorization:  Repeat     Home with mother.  Kathy Tyler Kathy ObeyM Kathy Tyler 01/08/2018, 8:48 AM

## 2018-01-08 NOTE — Lactation Note (Signed)
This note was copied from a baby's chart. Lactation Consultation Note  Patient Name: Kathy Tyler OZHYQ'MToday's Date: 01/08/2018  Mom and baby for discharge today.  Mom states baby is feeding well but some soreness on left side.  Mom using cross cradle hold.  Recommended she try football hold with next feeding.  Reviewed position with mom.  She is using coconut oil after feeds.  Instructed to call for assist prn.  Lactation outpatient services and support reviewed and encouraged prn.   Maternal Data    Feeding    LATCH Score                   Interventions    Lactation Tools Discussed/Used     Consult Status      Huston FoleyMOULDEN, [name] 01/08/2018, 11:16 AM

## 2018-01-09 LAB — BPAM RBC
Blood Product Expiration Date: 201904222359
Blood Product Expiration Date: 201904232359
Blood Product Expiration Date: 201904262359
Blood Product Expiration Date: 201904262359
ISSUE DATE / TIME: 201904100822
ISSUE DATE / TIME: 201904111038
Unit Type and Rh: 6200
Unit Type and Rh: 6200
Unit Type and Rh: 6200
Unit Type and Rh: 6200

## 2018-01-09 LAB — TYPE AND SCREEN
ABO/RH(D): A POS
Antibody Screen: NEGATIVE
Unit division: 0
Unit division: 0
Unit division: 0
Unit division: 0

## 2018-01-15 ENCOUNTER — Telehealth: Payer: Self-pay | Admitting: Hematology

## 2018-01-15 NOTE — Telephone Encounter (Signed)
Patient left vm about a certain medicine and wanted to follow up with Dr. Candise CheKale. She didn't know if she could just speak with him. I sched. An appointment also she will ask nurse

## 2018-01-20 ENCOUNTER — Telehealth: Payer: Self-pay | Admitting: *Deleted

## 2018-01-21 NOTE — Telephone Encounter (Signed)
Any details to this message? How much weight loss, what weight is the patient now?

## 2018-01-22 NOTE — Telephone Encounter (Signed)
Twenty minute call.    "Questions for the nurse.  Dr. Candise CheKale ordered high dose Lovenox.  My OB/GYN instructed me to call because I weighed 131 lbs today.  She's concerned if dose is correct based on my weight.  I'm really uneasy.  I doubt I return to 108 lbs I weighed before pregnancy but I'm two weeks post-partum and loosing weight so quickly.  Everyday I inject 60 mg twice a day.  What is the correct dose?  I think he said to use Lovenox for a few months.  Don't know exactly how long or when to stop.  Called scheduler to see Dr. Candise CheKale yet scheduled for May 14 th as 'the first available'.  I think that's too long to wait and should be seen sooner."       Advised provider used Lovenox protocol to prescribe correct dose for ordering.  [facility] correct Lovenox Q12 dosing is 1 mg/kg.  Weight 01-07-2018 = 139 lbs or 63.1 kg Weight 01-20-2018 = 131 lbs or 59.5 kg Both weights qualify for Lovenox 60 mg twice daily.    "Okay, I'm not as uptight.  I actually weighed myself at home today.  I still want to be seen.  Is there a list for patient to come in place of cancellations?"  Patient expecting return call in reference to F/U appointment.  Call ended with Leonia ReaderAbbie Tessier understanding requests being sent to provider for review along with provider [facility] return date is Monday, April 29th, Triage has no knowledge of provider cancellation process, at this time May 14th is first available, work in or rescheduling requires orders from provider granted a scheduling accommodation is identified.

## 2018-01-23 DIAGNOSIS — H5213 Myopia, bilateral: Secondary | ICD-10-CM | POA: Diagnosis not present

## 2018-02-09 NOTE — Progress Notes (Signed)
HEMATOLOGY/ONCOLOGY CLINIC NOTE  Date of Service: 02/10/2018  Patient Care Team: Zelphia Cairo, MD as PCP - General (Obstetrics and Gynecology)  CHIEF COMPLAINTS:  F/u for DVT -post partum    HISTORY OF PRESENTING ILLNESS:   Kathy Tyler is a wonderful 37 y.o. female who has been referred to [place] by OBGYN Dr. Renaldo Fiddler, from [facility], for evaluation and management of DVT during pregnancy.   She presents to the clinic today [redacted] weeks pregnant accompanied by her mother. She notes she had pain in her left leg above her knee around Christmas time. She was diagnosed with DVT on 09/25/17 (Nonocclusive deep venous thrombosis left lower extremity within the tibial peroneal trunk (the proximal calf).)after presenting to the ED for pain in her left leg. The ED treated her with Lovenox injections  BID and she has continued this regimen. Since then she had pains in her legs occured for 3 days and then went away. She denies bleeding from Lovenox and has occasional bruising at injection site. She sees her OBGYN every 2 weeks now and her expected due date is 3 months from now.   She had a C-section in 2017 from her first child birth. After the surgery she has several vaginal clots.  She notes after her first C-section had some anemia. She did not need iron replacements or blood transfusions.  She is considering a C-section again for this pregnancy.  She denies family history of blood clots or blood disorders.  She has a Gyn history of G3P1A1. She had a miscarriage in 2016 at 5 weeks. She denies significant history of smoking. 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 She denies having varicose veins before. In the past she was on oral contraceptives for a total of 5-10 year with no concern for VTE. She has been on Zoloft in the past for anxiety, but no longer on this medication.  On review of symptoms, pt notes having redness, itching and swelling  around her eyes recently. PCP and dermatologist  suspect her reactions to be from environmental allergies. She denies any significant diagnosis. She notes no CP or SOB. She denies abnormal leg swelling or discoloration.    INTERVAL HISTORY:    [name] presents today for follow up of DVT. She is accompanied by her husband, Rosanne Sack. Since her last visit, she underwent planned cesarean section for delivery of a baby girl on 01/06/18.   She notes her surgery went well and she is still on maternity leave. She is getting rest when needed. Pt notes her OBGYN wanted to reviewed her Lovenox. She is currently on  BID. Before delivery she had taken prophylactic Lovenox. She notes she plans to start Zoloft and did not want her drugs to interact. Last week she notes she stopped taking Ferrous Sulfate once daily.   On review of symptoms, pt denies leg pain or swelling.   MEDICAL HISTORY:  Past Medical History:  Diagnosis Date  . Anemia   . Anxiety    general and pp  . Depression    pp anx and depression  . DVT (deep vein thrombosis) in pregnancy (HCC)    12/18  . Hypercholesterolemia   . Mental disorder     SURGICAL HISTORY: Past Surgical History:  Procedure Laterality Date  . CESAREAN SECTION N/A 12/21/2015   Procedure: CESAREAN SECTION;  Surgeon: Zelphia Cairo, MD;  Location: [facility];  Service: Obstetrics;  Laterality:  N/A;  . CESAREAN SECTION N/A 01/06/2018   Procedure: REPEAT CESAREAN SECTION;  Surgeon: Zelphia Cairo, MD;  Location: [facility];  Service: Obstetrics;  Laterality: N/A;  Repeat edc 01/12/18 allergy to tobramycin, ophthalmic solution Tracey RNFA  . WISDOM TOOTH EXTRACTION  2007    SOCIAL HISTORY: Social History   Socioeconomic History  . Marital status: Married    Spouse name: Not on file  . Number of children: Not on file  . Years of education: Not on file  . Highest education level: Not on file  Occupational History  . Not on file  Social Needs  .  Financial resource strain: Not on file  . Food insecurity:    Worry: Not on file    Inability: Not on file  . Transportation needs:    Medical: Not on file    Non-medical: Not on file  Tobacco Use  . Smoking status: Never Smoker  . Smokeless tobacco: Never Used  Substance and Sexual Activity  . Alcohol use: No  . Drug use: No  . Sexual activity: Yes    Birth control/protection: None  Lifestyle  . Physical activity:    Days per week: Not on file    Minutes per session: Not on file  . Stress: Not on file  Relationships  . Social connections:    Talks on phone: Not on file    Gets together: Not on file    Attends religious service: Not on file    Active member of club or organization: Not on file    Attends meetings of clubs or organizations: Not on file    Relationship status: Not on file  . Intimate partner violence:    Fear of current or ex partner: Not on file    Emotionally abused: Not on file    Physically abused: Not on file    Forced sexual activity: Not on file  Other Topics Concern  . Not on file  Social History Narrative  . Not on file    FAMILY HISTORY: Family History  Problem Relation Age of Onset  . Heart disease Maternal Grandmother   . Diabetes Maternal Grandmother   . Bladder Cancer Maternal Grandmother   . Stroke Maternal Grandfather   . Colon cancer Maternal Grandfather   . Heart disease Paternal Grandfather   . Heart attack Paternal Grandfather   . Hypertension Paternal Grandfather   . Atrial fibrillation Mother     ALLERGIES:  is allergic to tobramycin.  MEDICATIONS:  Current Outpatient Medications  Medication Sig Dispense Refill  . enoxaparin (LOVENOX) 60 MG/0.6ML injection Inject 0.6 mLs (60 mg total) into the skin every 12 (twelve) hours. 30 mL 2  . ofloxacin (OCUFLOX) 0.3 % ophthalmic solution Place 1 drop into both eyes 3 (three) times daily.    . Prenat-FeCbn-FeAsp-Meth-FA-DHA (PRENATE MINI) 18-0.6-0.4-350 MG CAPS Take 1 tablet by  mouth every evening.  6   No current facility-administered medications for this visit.     REVIEW OF SYSTEMS: .10 Point review of Systems was done is negative except as noted above.  PHYSICAL EXAMINATION: ECOG PERFORMANCE STATUS: 0 - Asymptomatic  . Vitals:   02/10/18 1417  BP: 110/66  Pulse: 71  Resp: 17  Temp: 98 F (36.7 C)  SpO2: 100%   Filed Weights   02/10/18 1417  Weight: 131 lb 9.6 oz (59.7 kg)   .Body mass index is 24.07 kg/m.  GENERAL:alert, in no acute distress and comfortable SKIN: no acute rashes, no significant  lesions EYES: conjunctiva are pink and non-injected, sclera anicteric OROPHARYNX: MMM, no exudates, no oropharyngeal erythema or ulceration NECK: supple, no JVD LYMPH:  no palpable lymphadenopathy in the cervical, axillary or inguinal regions LUNGS: clear to auscultation b/l with normal respiratory effort HEART: regular rate & rhythm ABDOMEN:  normoactive bowel sounds , non tender, not distended. Extremity: no pedal edema PSYCH: alert & oriented x 3 with fluent speech NEURO: no focal motor/sensory deficits   LABORATORY DATA:  I have reviewed the data as listed  . CBC Latest Ref Rng & Units 01/07/2018 01/05/2018 12/12/2017  WBC 4.0 - 10.5 K/uL 11.7(H) 10.5 10.9(H)  Hemoglobin 12.0 - 15.0 g/dL 0.1(U) 11.8(L) 10.5(L)  Hematocrit 36.0 - 46.0 % 25.7(L) 34.4(L) 30.7(L)  Platelets 150 - 400 K/uL 186 311 273  HGB 10.5 . CMP Latest Ref Rng & Units 12/12/2017  Glucose 70 - 140 mg/dL 91  BUN 7 - 26 mg/dL 5(L)  Creatinine 2.72 - 1.10 mg/dL 5.36  Sodium 644 - 034 mmol/L 134(L)  Potassium 3.5 - 5.1 mmol/L 4.1  Chloride 98 - 109 mmol/L 104  CO2 22 - 29 mmol/L 24  Calcium 8.4 - 10.4 mg/dL 9.0  Total Protein 6.4 - 8.3 g/dL 6.5  Total Bilirubin 0.2 - 1.2 mg/dL 0.3  Alkaline Phos 40 - 150 U/L 119  AST 5 - 34 U/L 12  ALT 0 - 55 U/L 10   . Lab Results  Component Value Date   IRON 92 10/14/2017   TIBC 466 (H) 10/14/2017   IRONPCTSAT 20 (L) 10/14/2017    (Iron and TIBC)  Lab Results  Component Value Date   FERRITIN 10 12/12/2017     Component     Latest Ref Rng & Units 10/14/2017  Iron     41 - 142 ug/dL 92  TIBC     742 - 595 ug/dL 638 (H)  Saturation Ratios     21 - 57 % 20 (L)  UIBC     ug/dL 756  Anticardiolipin Ab,IgG,Qn     0 - 14 GPL U/mL <9  Anticardiolipin Ab,IgM,Qn     0 - 12 MPL U/mL 10  Anticardiolipin Ab,IgA,Qn     0 - 11 APL U/mL <9  PTT Lupus Anticoagulant     0.0 - 51.9 sec 33.8  DRVVT     0.0 - 47.0 sec 34.8  Lupus Anticoag Interp      Comment:  Beta-2 Glycoprotein I Ab, IgG     0 - 20 GPI IgG units <9  Beta-2-Glycoprotein I IgM     0 - 32 GPI IgM units <9  Beta-2-Glycoprotein I IgA     0 - 25 GPI IgA units <9  Ferritin     9 - 269 ng/mL 12    Lupus anticoagulant panel      The value has a corrected status.      No reference range information available      Resulting Lab:  CLINICAL LABORATORY      Comments:           (NOTE)           No lupus anticoagulant was detected.   factor V Leiden mutation and prothrombin gene mutation - negative   RADIOGRAPHIC STUDIES: I have personally reviewed the radiological images as listed and agreed with the findings in the report. No results found.   [place] LOWER EXTREMITY VENOUS DUPLEX LEFT 09/25/17  FINDINGS: Grayscale, color, and pulse wave Doppler evaluation of the  deep venous system of the left lower 70 was performed. There is nonocclusive thrombus seen within the proximal left calf within the tibial peroneal trunk which is incompletely compressible. All other veins of the deep venous system of the left lower extremity from the common femoral vein through the popliteal vein demonstrate compression augmentation, and spontaneous phasic venous flow. No evidence of deep venous thrombosis within the contralateral right common femoral vein. IMPRESSION: 1. Nonocclusive deep venous thrombosis left lower extremity within the tibial peroneal trunk (the  proximal calf).   ASSESSMENT & PLAN:   Lyriq Finerty is a 37 y.o. female with   1. Acute non occlusive no symptoms suggestive of pulmonary embolism DVT  left lower extremity within the tibial peroneal trunk (the proximal calf).- diagnosed at [redacted] weeks pregnant. With no other provokation, this is likely secondary to her pregnancy/hormonal trigger.  -09/25/17 Doppler shows a below-the-knee non-occlusive blood clot is low risk.  -No symptoms suggestive of pulmonary embolism. -No previous personal history or family history of venous thromboembolism. -limited hypercoag w/u which was neg for FVL and prothrombin gene mutation and APLA Ab neg. -Previously on Lovenox  [place] Q12h until labor and preventative dose lovenox 1 day after delivery for 6 weeks.    Plan   -She had cesarean section on 01/06/18. I reviewed her lovenox use in detail. She will continue for at least 6 weeks after delivery we will do preventive dose lovenox at  once daily. Given her estrogen triggered clot she is fine to stop lovenox after her 6 weeks post partum. -I again discussed this is a hormonal triggered blood clot, I do not recommend estrogen containing oral contraception use in the future  - it would be recommended to do preventive dose of lovenox  if she became pregnant again in the future. -Discussed again the precautions to avoid a future DVT including but not limited to: wearing graded compression stockings on long travel and standing every hour for prolonged travel or flights, stay well hydrated and do not deeply fold her legs for long periods of time.  -If she intends to travel very long distance she may take baby aspirin for a few days before and after her traveling.  -I will see her as needed in the future. She will continue to be closely followed by her OBGYN.    2. Iron deficiency during pregnancy  . Lab Results  Component Value Date   IRON 92 10/14/2017   TIBC 466 (H) 10/14/2017   IRONPCTSAT 20 (L)  10/14/2017   (Iron and TIBC)  Lab Results  Component Value Date   FERRITIN 10 12/12/2017   Plan  -Previously recommended taking Iron polysaccharide  po daily in addition to Prenatal vitamins -she is taking ferosol (ferrous sulfate) prescription once daily. Advised the patient and her husband that she would be able to increase her dose from once daily to BID to increase her ferritin levels.  -f/u with PCP to optimize iron replacement post partum to maintain ferritin levels of close to 50.  -She will continue oral iron ferrous sulfate and monitor her iron levels with her OBGYN. Her Ferritin should be at least 50. If lower she can increase to Bid and if 150 or higher she can continue on once daily.    All of the patients questions were answered with apparent satisfaction. The patient knows to call the clinic with any problems, questions or concerns.  The total time spent in the appointment was 20 minutes and more than 50%  was on counseling and direct patient cares.    Wyvonnia Lora MD MS AAHIVMS [facility] [place] Hematology/Oncology Physician [facility]  (Office):       [phone] (Work cell):  [phone] (Fax):           [phone]  02/10/2018 2:56 PM  This document serves as a record of services personally performed by Wyvonnia Lora, MD. It was created on his behalf by Delphina Cahill, a trained medical scribe. The creation of this record is based on the scribe's personal observations and the provider's statements to them.    .I have reviewed the above documentation for accuracy and completeness, and I agree with the above.   Johney Maine MD MS

## 2018-02-10 ENCOUNTER — Inpatient Hospital Stay: Payer: BLUE CROSS/BLUE SHIELD

## 2018-02-10 ENCOUNTER — Inpatient Hospital Stay: Payer: BLUE CROSS/BLUE SHIELD | Attending: Hematology | Admitting: Hematology

## 2018-02-10 ENCOUNTER — Ambulatory Visit: Payer: BLUE CROSS/BLUE SHIELD

## 2018-02-10 ENCOUNTER — Other Ambulatory Visit: Payer: Self-pay | Admitting: Hematology

## 2018-02-10 ENCOUNTER — Encounter: Payer: Self-pay | Admitting: Hematology

## 2018-02-10 VITALS — BP 110/66 | HR 71 | Temp 98.0°F | Resp 17 | Ht 62.0 in | Wt 131.6 lb

## 2018-02-10 DIAGNOSIS — I82409 Acute embolism and thrombosis of unspecified deep veins of unspecified lower extremity: Secondary | ICD-10-CM | POA: Diagnosis not present

## 2018-02-10 DIAGNOSIS — D649 Anemia, unspecified: Secondary | ICD-10-CM

## 2018-02-10 DIAGNOSIS — D509 Iron deficiency anemia, unspecified: Secondary | ICD-10-CM | POA: Insufficient documentation

## 2018-02-10 DIAGNOSIS — O2230 Deep phlebothrombosis in pregnancy, unspecified trimester: Secondary | ICD-10-CM

## 2018-02-11 ENCOUNTER — Telehealth: Payer: Self-pay

## 2018-02-11 NOTE — Telephone Encounter (Signed)
RTC with Dr Kale as needed. Per 5/14 los 

## 2018-02-17 DIAGNOSIS — Z1389 Encounter for screening for other disorder: Secondary | ICD-10-CM | POA: Diagnosis not present

## 2018-05-07 DIAGNOSIS — J069 Acute upper respiratory infection, unspecified: Secondary | ICD-10-CM | POA: Diagnosis not present

## 2018-05-09 DIAGNOSIS — J029 Acute pharyngitis, unspecified: Secondary | ICD-10-CM | POA: Diagnosis not present

## 2018-06-25 DIAGNOSIS — L309 Dermatitis, unspecified: Secondary | ICD-10-CM | POA: Diagnosis not present

## 2018-06-25 DIAGNOSIS — D225 Melanocytic nevi of trunk: Secondary | ICD-10-CM | POA: Diagnosis not present

## 2018-07-17 DIAGNOSIS — Z23 Encounter for immunization: Secondary | ICD-10-CM | POA: Diagnosis not present

## 2018-11-02 DIAGNOSIS — L814 Other melanin hyperpigmentation: Secondary | ICD-10-CM | POA: Diagnosis not present

## 2018-11-02 DIAGNOSIS — D225 Melanocytic nevi of trunk: Secondary | ICD-10-CM | POA: Diagnosis not present

## 2019-10-08 ENCOUNTER — Ambulatory Visit: Payer: BC Managed Care – PPO | Attending: Internal Medicine

## 2019-10-08 DIAGNOSIS — Z20822 Contact with and (suspected) exposure to covid-19: Secondary | ICD-10-CM | POA: Diagnosis not present

## 2019-10-10 LAB — NOVEL CORONAVIRUS, NAA: SARS-CoV-2, NAA: NOT DETECTED

## 2019-10-26 ENCOUNTER — Ambulatory Visit: Payer: BC Managed Care – PPO | Attending: Internal Medicine

## 2019-10-26 DIAGNOSIS — Z20822 Contact with and (suspected) exposure to covid-19: Secondary | ICD-10-CM | POA: Diagnosis not present

## 2019-10-27 LAB — NOVEL CORONAVIRUS, NAA: SARS-CoV-2, NAA: NOT DETECTED

## 2019-12-08 ENCOUNTER — Ambulatory Visit: Payer: BC Managed Care – PPO | Attending: Internal Medicine

## 2019-12-08 DIAGNOSIS — Z20822 Contact with and (suspected) exposure to covid-19: Secondary | ICD-10-CM | POA: Diagnosis not present

## 2019-12-09 LAB — NOVEL CORONAVIRUS, NAA: SARS-CoV-2, NAA: NOT DETECTED

## 2020-03-16 DIAGNOSIS — Z803 Family history of malignant neoplasm of breast: Secondary | ICD-10-CM | POA: Diagnosis not present

## 2020-03-16 DIAGNOSIS — Z6820 Body mass index (BMI) 20.0-20.9, adult: Secondary | ICD-10-CM | POA: Diagnosis not present

## 2020-03-16 DIAGNOSIS — Z8042 Family history of malignant neoplasm of prostate: Secondary | ICD-10-CM | POA: Diagnosis not present

## 2020-03-16 DIAGNOSIS — Z01419 Encounter for gynecological examination (general) (routine) without abnormal findings: Secondary | ICD-10-CM | POA: Diagnosis not present

## 2020-03-16 DIAGNOSIS — Z800 Family history of malignant neoplasm of digestive organs: Secondary | ICD-10-CM | POA: Diagnosis not present

## 2020-03-16 DIAGNOSIS — F418 Other specified anxiety disorders: Secondary | ICD-10-CM | POA: Diagnosis not present

## 2020-03-16 DIAGNOSIS — Z808 Family history of malignant neoplasm of other organs or systems: Secondary | ICD-10-CM | POA: Diagnosis not present

## 2020-03-23 DIAGNOSIS — Z13220 Encounter for screening for lipoid disorders: Secondary | ICD-10-CM | POA: Diagnosis not present

## 2020-03-23 DIAGNOSIS — Z1321 Encounter for screening for nutritional disorder: Secondary | ICD-10-CM | POA: Diagnosis not present

## 2020-03-23 DIAGNOSIS — Z131 Encounter for screening for diabetes mellitus: Secondary | ICD-10-CM | POA: Diagnosis not present

## 2020-03-23 DIAGNOSIS — Z1329 Encounter for screening for other suspected endocrine disorder: Secondary | ICD-10-CM | POA: Diagnosis not present

## 2020-04-25 DIAGNOSIS — Z809 Family history of malignant neoplasm, unspecified: Secondary | ICD-10-CM | POA: Diagnosis not present

## 2020-06-11 DIAGNOSIS — J019 Acute sinusitis, unspecified: Secondary | ICD-10-CM | POA: Diagnosis not present

## 2020-07-10 DIAGNOSIS — Z13220 Encounter for screening for lipoid disorders: Secondary | ICD-10-CM | POA: Diagnosis not present

## 2020-07-10 DIAGNOSIS — R002 Palpitations: Secondary | ICD-10-CM | POA: Diagnosis not present

## 2020-07-20 ENCOUNTER — Other Ambulatory Visit: Payer: Self-pay | Admitting: *Deleted

## 2020-07-20 DIAGNOSIS — R002 Palpitations: Secondary | ICD-10-CM

## 2020-08-01 ENCOUNTER — Encounter (INDEPENDENT_AMBULATORY_CARE_PROVIDER_SITE_OTHER): Payer: BC Managed Care – PPO

## 2020-08-01 DIAGNOSIS — R002 Palpitations: Secondary | ICD-10-CM

## 2020-09-25 DIAGNOSIS — Z03818 Encounter for observation for suspected exposure to other biological agents ruled out: Secondary | ICD-10-CM | POA: Diagnosis not present

## 2020-10-19 DIAGNOSIS — Z20822 Contact with and (suspected) exposure to covid-19: Secondary | ICD-10-CM | POA: Diagnosis not present

## 2020-10-19 DIAGNOSIS — Z03818 Encounter for observation for suspected exposure to other biological agents ruled out: Secondary | ICD-10-CM | POA: Diagnosis not present

## 2020-10-24 DIAGNOSIS — Z20822 Contact with and (suspected) exposure to covid-19: Secondary | ICD-10-CM | POA: Diagnosis not present

## 2020-12-12 DIAGNOSIS — R350 Frequency of micturition: Secondary | ICD-10-CM | POA: Diagnosis not present

## 2020-12-12 DIAGNOSIS — M6289 Other specified disorders of muscle: Secondary | ICD-10-CM | POA: Diagnosis not present

## 2020-12-12 DIAGNOSIS — N393 Stress incontinence (female) (male): Secondary | ICD-10-CM | POA: Diagnosis not present

## 2020-12-12 DIAGNOSIS — M6281 Muscle weakness (generalized): Secondary | ICD-10-CM | POA: Diagnosis not present

## 2020-12-14 DIAGNOSIS — J069 Acute upper respiratory infection, unspecified: Secondary | ICD-10-CM | POA: Diagnosis not present

## 2020-12-14 DIAGNOSIS — N764 Abscess of vulva: Secondary | ICD-10-CM | POA: Diagnosis not present

## 2020-12-14 DIAGNOSIS — J101 Influenza due to other identified influenza virus with other respiratory manifestations: Secondary | ICD-10-CM | POA: Diagnosis not present

## 2020-12-14 DIAGNOSIS — Z03818 Encounter for observation for suspected exposure to other biological agents ruled out: Secondary | ICD-10-CM | POA: Diagnosis not present

## 2021-01-22 DIAGNOSIS — N393 Stress incontinence (female) (male): Secondary | ICD-10-CM | POA: Diagnosis not present

## 2021-01-22 DIAGNOSIS — M6281 Muscle weakness (generalized): Secondary | ICD-10-CM | POA: Diagnosis not present

## 2021-01-22 DIAGNOSIS — M6289 Other specified disorders of muscle: Secondary | ICD-10-CM | POA: Diagnosis not present

## 2021-01-22 DIAGNOSIS — R350 Frequency of micturition: Secondary | ICD-10-CM | POA: Diagnosis not present

## 2021-01-23 DIAGNOSIS — H01114 Allergic dermatitis of left upper eyelid: Secondary | ICD-10-CM | POA: Diagnosis not present

## 2021-01-23 DIAGNOSIS — H01111 Allergic dermatitis of right upper eyelid: Secondary | ICD-10-CM | POA: Diagnosis not present

## 2021-01-23 DIAGNOSIS — H1013 Acute atopic conjunctivitis, bilateral: Secondary | ICD-10-CM | POA: Diagnosis not present

## 2021-01-30 DIAGNOSIS — R350 Frequency of micturition: Secondary | ICD-10-CM | POA: Diagnosis not present

## 2021-01-30 DIAGNOSIS — M6289 Other specified disorders of muscle: Secondary | ICD-10-CM | POA: Diagnosis not present

## 2021-01-30 DIAGNOSIS — M6281 Muscle weakness (generalized): Secondary | ICD-10-CM | POA: Diagnosis not present

## 2021-01-30 DIAGNOSIS — M62838 Other muscle spasm: Secondary | ICD-10-CM | POA: Diagnosis not present

## 2021-02-06 DIAGNOSIS — Z3169 Encounter for other general counseling and advice on procreation: Secondary | ICD-10-CM | POA: Diagnosis not present

## 2021-02-09 DIAGNOSIS — M62838 Other muscle spasm: Secondary | ICD-10-CM | POA: Diagnosis not present

## 2021-02-09 DIAGNOSIS — M6281 Muscle weakness (generalized): Secondary | ICD-10-CM | POA: Diagnosis not present

## 2021-02-09 DIAGNOSIS — R3915 Urgency of urination: Secondary | ICD-10-CM | POA: Diagnosis not present

## 2021-02-09 DIAGNOSIS — M6289 Other specified disorders of muscle: Secondary | ICD-10-CM | POA: Diagnosis not present

## 2021-02-18 DIAGNOSIS — R0981 Nasal congestion: Secondary | ICD-10-CM | POA: Diagnosis not present

## 2021-02-18 DIAGNOSIS — R059 Cough, unspecified: Secondary | ICD-10-CM | POA: Diagnosis not present

## 2021-02-18 DIAGNOSIS — U071 COVID-19: Secondary | ICD-10-CM | POA: Diagnosis not present

## 2021-02-18 DIAGNOSIS — R509 Fever, unspecified: Secondary | ICD-10-CM | POA: Diagnosis not present

## 2021-03-09 DIAGNOSIS — L309 Dermatitis, unspecified: Secondary | ICD-10-CM | POA: Diagnosis not present

## 2021-03-09 DIAGNOSIS — D225 Melanocytic nevi of trunk: Secondary | ICD-10-CM | POA: Diagnosis not present

## 2021-03-09 DIAGNOSIS — L578 Other skin changes due to chronic exposure to nonionizing radiation: Secondary | ICD-10-CM | POA: Diagnosis not present

## 2021-03-09 DIAGNOSIS — L814 Other melanin hyperpigmentation: Secondary | ICD-10-CM | POA: Diagnosis not present

## 2021-04-06 DIAGNOSIS — M6281 Muscle weakness (generalized): Secondary | ICD-10-CM | POA: Diagnosis not present

## 2021-04-06 DIAGNOSIS — M62838 Other muscle spasm: Secondary | ICD-10-CM | POA: Diagnosis not present

## 2021-04-06 DIAGNOSIS — R3915 Urgency of urination: Secondary | ICD-10-CM | POA: Diagnosis not present

## 2021-04-06 DIAGNOSIS — M6289 Other specified disorders of muscle: Secondary | ICD-10-CM | POA: Diagnosis not present

## 2021-05-01 DIAGNOSIS — Z01419 Encounter for gynecological examination (general) (routine) without abnormal findings: Secondary | ICD-10-CM | POA: Diagnosis not present

## 2021-05-01 DIAGNOSIS — Z6822 Body mass index (BMI) 22.0-22.9, adult: Secondary | ICD-10-CM | POA: Diagnosis not present

## 2021-05-11 ENCOUNTER — Emergency Department (HOSPITAL_BASED_OUTPATIENT_CLINIC_OR_DEPARTMENT_OTHER): Payer: BC Managed Care – PPO

## 2021-05-11 ENCOUNTER — Emergency Department (HOSPITAL_BASED_OUTPATIENT_CLINIC_OR_DEPARTMENT_OTHER)
Admission: EM | Admit: 2021-05-11 | Discharge: 2021-05-11 | Disposition: A | Discharge status: Home / Self Care | Payer: BC Managed Care – PPO | Attending: Emergency Medicine | Admitting: Emergency Medicine

## 2021-05-11 ENCOUNTER — Other Ambulatory Visit: Payer: Self-pay

## 2021-05-11 ENCOUNTER — Encounter (HOSPITAL_BASED_OUTPATIENT_CLINIC_OR_DEPARTMENT_OTHER): Payer: Self-pay | Admitting: Emergency Medicine

## 2021-05-11 DIAGNOSIS — Z7901 Long term (current) use of anticoagulants: Secondary | ICD-10-CM | POA: Diagnosis not present

## 2021-05-11 DIAGNOSIS — Z3A01 Less than 8 weeks gestation of pregnancy: Secondary | ICD-10-CM | POA: Diagnosis not present

## 2021-05-11 DIAGNOSIS — Z86718 Personal history of other venous thrombosis and embolism: Secondary | ICD-10-CM | POA: Insufficient documentation

## 2021-05-11 DIAGNOSIS — M25561 Pain in right knee: Secondary | ICD-10-CM | POA: Diagnosis not present

## 2021-05-11 DIAGNOSIS — M25461 Effusion, right knee: Secondary | ICD-10-CM | POA: Insufficient documentation

## 2021-05-11 DIAGNOSIS — M7989 Other specified soft tissue disorders: Secondary | ICD-10-CM | POA: Diagnosis not present

## 2021-05-11 DIAGNOSIS — M25469 Effusion, unspecified knee: Secondary | ICD-10-CM

## 2021-05-11 DIAGNOSIS — O99891 Other specified diseases and conditions complicating pregnancy: Secondary | ICD-10-CM | POA: Diagnosis not present

## 2021-05-11 NOTE — ED Provider Notes (Signed)
[facility] EMERGENCY DEPT Provider Note   CSN: [id number] Arrival date & time: 05/11/21  1342     History Chief Complaint  Patient presents with   Knee Pain    Kathy Tyler is a 40 y.o. female.  The history is provided by the patient.  Knee Pain Location:  Knee Injury: no   Knee location:  R knee Pain details:    Quality:  Aching and cramping   Radiates to:  Does not radiate   Severity:  Mild   Onset quality:  Gradual   Duration:  2 weeks   Timing:  Constant   Progression:  Unchanged Chronicity:  New Dislocation: no   Associated symptoms: no back pain and no fever       Past Medical History:  Diagnosis Date   Anemia    Anxiety    general and pp   Depression    pp anx and depression   DVT (deep vein thrombosis) in pregnancy    12/18   Hypercholesterolemia    Mental disorder     Patient Active Problem List   Diagnosis Date Noted   S/P cesarean section 01/06/2018   Postpartum care following cesarean delivery 12/22/2015   Pregnancy 12/20/2015    Past Surgical History:  Procedure Laterality Date   CESAREAN SECTION N/A 12/21/2015   Procedure: CESAREAN SECTION;  Surgeon: Zelphia Cairo, MD;  Location: [facility];  Service: Obstetrics;  Laterality: N/A;   CESAREAN SECTION N/A 01/06/2018   Procedure: REPEAT CESAREAN SECTION;  Surgeon: Zelphia Cairo, MD;  Location: [facility] BIRTHING SUITES;  Service: Obstetrics;  Laterality: N/A;  Repeat edc 01/12/18 allergy to tobramycin, ophthalmic solution Tracey RNFA   WISDOM TOOTH EXTRACTION  2007     OB History     Gravida  4   Para  2   Term  2   Preterm      AB  1   Living  2      SAB  1   IAB      Ectopic      Multiple  0   Live Births  2           Family History  Problem Relation Age of Onset   Heart disease Maternal Grandmother    Diabetes Maternal Grandmother    Bladder Cancer Maternal Grandmother    Stroke Maternal Grandfather    Colon cancer Maternal Grandfather     Heart disease Paternal Grandfather    Heart attack Paternal Grandfather    Hypertension Paternal Grandfather    Atrial fibrillation Mother     Social History   Tobacco Use   Smoking status: Never   Smokeless tobacco: Never  Vaping Use   Vaping Use: Never used  Substance Use Topics   Alcohol use: No   Drug use: No    Home Medications Prior to Admission medications   Medication Sig Start Date End Date Taking? Authorizing Provider  enoxaparin (LOVENOX) 60 MG/0.6ML injection Inject 0.6 mLs (60 mg total) into the skin every 12 (twelve) hours. 01/08/18   Richarda Overlie, MD  ofloxacin (OCUFLOX) 0.3 % ophthalmic solution Place 1 drop into both eyes 3 (three) times daily.    [provider]  Prenat-FeCbn-FeAsp-Meth-FA-DHA (PRENATE MINI) 18-0.6-0.4-350 MG CAPS Take 1 tablet by mouth every evening. 11/30/17   [provider]    Allergies    Tobramycin  Review of Systems   Review of Systems  Constitutional:  Negative for chills and fever.  HENT:  Negative  for ear pain and sore throat.   Eyes:  Negative for pain and visual disturbance.  Respiratory:  Negative for cough and shortness of breath.   Cardiovascular:  Negative for chest pain and palpitations.  Gastrointestinal:  Negative for abdominal pain and vomiting.  Genitourinary:  Negative for dysuria and hematuria.  Musculoskeletal:  Positive for arthralgias. Negative for back pain.  Skin:  Negative for color change and rash.  Neurological:  Negative for seizures and syncope.  All other systems reviewed and are negative.  Physical Exam Updated Vital Signs BP 105/64 (BP Location: Right Arm)   Pulse 75   Temp 98.5 F (36.9 C) (Oral)   Resp (!) 21   Ht 5\' 2"  (1.575 m)   Wt 56.7 kg   SpO2 100%   BMI 22.86 kg/m   Physical Exam Vitals and nursing note reviewed.  Constitutional:      General: She is not in acute distress.    Appearance: She is well-developed.  HENT:     Head: Normocephalic and atraumatic.   Eyes:     Conjunctiva/sclera: Conjunctivae normal.  Cardiovascular:     Rate and Rhythm: Normal rate and regular rhythm.     Heart sounds: No murmur heard. Pulmonary:     Effort: Pulmonary effort is normal. No respiratory distress.     Breath sounds: Normal breath sounds.  Abdominal:     Palpations: Abdomen is soft.     Tenderness: There is no abdominal tenderness.  Musculoskeletal:        General: No swelling or tenderness. Normal range of motion.     Cervical back: Neck supple.     Comments: Right knee with intact range of motion, no tenderness to palpation, no palpable joint effusion.  Skin:    General: Skin is warm and dry.  Neurological:     Mental Status: She is alert.    ED Results / Procedures / Treatments   Labs (all labs ordered are listed, but only abnormal results are displayed) Labs Reviewed - No data to display  EKG None  Radiology Venous Img Lower Unilateral Right  Result Date: 05/11/2021 CLINICAL DATA:  Right knee pain x2 weeks, history of DVT, on Lovenox EXAM: RIGHT LOWER EXTREMITY VENOUS DOPPLER ULTRASOUND TECHNIQUE: Gray-scale sonography with compression, as well as color and duplex ultrasound, were performed to evaluate the deep venous system(s) from the level of the common femoral vein through the popliteal and proximal calf veins. COMPARISON:  None. FINDINGS: VENOUS Normal compressibility of the common femoral, superficial femoral, and popliteal veins, as well as the visualized calf veins. Visualized portions of profunda femoral vein and great saphenous vein unremarkable. No filling defects to suggest DVT on grayscale or color Doppler imaging. Doppler waveforms show normal direction of venous flow, normal respiratory phasicity and response to augmentation. Limited views of the contralateral common femoral vein are unremarkable. OTHER 2.4 cm deep subcutaneous fluid collection inferior to the patella. Limitations: none IMPRESSION: 1. Negative for right lower  extremity DVT. 2. Right infrapatellar fluid collection, incompletely characterized. If symptoms persist, knee MR may be useful for further evaluation Electronically Signed   By: 07/11/2021 M.D.   On: 05/11/2021 14:36    Procedures Procedures   Medications Ordered in ED Medications - No data to display  ED Course  I have reviewed the triage vital signs and the nursing notes.  Pertinent labs & imaging results that were available during my care of the patient were reviewed by me and considered in  my medical decision making (see chart for details).    MDM Rules/Calculators/A&P                           40 year old female presenting to the emergency department [redacted] weeks pregnant by LMP with right knee pain in the posterior fossa for the past 1.5 weeks.  The patient states that during her previous pregnancy, she developed a blood clot and wanted to be evaluated for possible blood clot in her leg.  On exam, the patient has no tenderness of the calf, intact range of motion of the right knee with no palpable joint effusion.  Low concern for acute fracture.  No sign of injury to the affected area.  DVT ultrasound was performed which revealed no evidence of DVT.  I do not think further work-up is indicated acutely at this time.  Patient informed of the negative result and plans to follow-up with her OB.  Final Clinical Impression(s) / ED Diagnoses Final diagnoses:  Knee swelling    Rx / DC Orders ED Discharge Orders     None        Ernie Avena, MD 05/12/21 1650

## 2021-05-11 NOTE — ED Triage Notes (Signed)
Pt arrives to ED with c/o of right knee and leg pain x1.5 weeks. Pt reports she is [redacted] weeks pregnant and she developed a blood clot in this knee during her last pregnancy.

## 2021-05-11 NOTE — ED Notes (Signed)
Patient verbalizes understanding of discharge instructions. Opportunity for questioning and answers were provided. Patient discharged from ED.  °

## 2021-06-06 DIAGNOSIS — N911 Secondary amenorrhea: Secondary | ICD-10-CM | POA: Diagnosis not present

## 2021-06-19 DIAGNOSIS — Z3A10 10 weeks gestation of pregnancy: Secondary | ICD-10-CM | POA: Diagnosis not present

## 2021-06-19 DIAGNOSIS — Z3481 Encounter for supervision of other normal pregnancy, first trimester: Secondary | ICD-10-CM | POA: Diagnosis not present

## 2021-06-19 DIAGNOSIS — Z3685 Encounter for antenatal screening for Streptococcus B: Secondary | ICD-10-CM | POA: Diagnosis not present

## 2021-06-19 DIAGNOSIS — Z3401 Encounter for supervision of normal first pregnancy, first trimester: Secondary | ICD-10-CM | POA: Diagnosis not present

## 2021-06-19 DIAGNOSIS — Z3A11 11 weeks gestation of pregnancy: Secondary | ICD-10-CM | POA: Diagnosis not present

## 2021-06-26 DIAGNOSIS — Z3A11 11 weeks gestation of pregnancy: Secondary | ICD-10-CM | POA: Diagnosis not present

## 2021-06-26 DIAGNOSIS — Z113 Encounter for screening for infections with a predominantly sexual mode of transmission: Secondary | ICD-10-CM | POA: Diagnosis not present

## 2021-06-26 DIAGNOSIS — Z3401 Encounter for supervision of normal first pregnancy, first trimester: Secondary | ICD-10-CM | POA: Diagnosis not present

## 2021-06-26 DIAGNOSIS — Z3400 Encounter for supervision of normal first pregnancy, unspecified trimester: Secondary | ICD-10-CM | POA: Diagnosis not present

## 2021-07-05 DIAGNOSIS — Z3A12 12 weeks gestation of pregnancy: Secondary | ICD-10-CM | POA: Diagnosis not present

## 2021-07-05 DIAGNOSIS — O09521 Supervision of elderly multigravida, first trimester: Secondary | ICD-10-CM | POA: Diagnosis not present

## 2021-07-05 DIAGNOSIS — Z3682 Encounter for antenatal screening for nuchal translucency: Secondary | ICD-10-CM | POA: Diagnosis not present

## 2021-07-05 DIAGNOSIS — Z1371 Encounter for nonprocreative screening for genetic disease carrier status: Secondary | ICD-10-CM | POA: Diagnosis not present

## 2021-08-03 DIAGNOSIS — Z3A16 16 weeks gestation of pregnancy: Secondary | ICD-10-CM | POA: Diagnosis not present

## 2021-08-03 DIAGNOSIS — Z23 Encounter for immunization: Secondary | ICD-10-CM | POA: Diagnosis not present

## 2021-08-03 DIAGNOSIS — O368314 Maternal care for abnormalities of the fetal heart rate or rhythm, first trimester, fetus 4: Secondary | ICD-10-CM | POA: Diagnosis not present

## 2021-08-03 DIAGNOSIS — O021 Missed abortion: Secondary | ICD-10-CM | POA: Diagnosis not present

## 2021-08-03 DIAGNOSIS — Z86718 Personal history of other venous thrombosis and embolism: Secondary | ICD-10-CM | POA: Diagnosis not present

## 2021-08-06 ENCOUNTER — Encounter (HOSPITAL_COMMUNITY): Payer: Self-pay | Admitting: Obstetrics and Gynecology

## 2021-08-06 ENCOUNTER — Inpatient Hospital Stay (HOSPITAL_COMMUNITY)
Admission: AD | Admit: 2021-08-06 | Discharge: 2021-08-07 | DRG: 807 | Disposition: A | Discharge status: Home / Self Care | Payer: BC Managed Care – PPO | Attending: Obstetrics and Gynecology | Admitting: Obstetrics and Gynecology

## 2021-08-06 ENCOUNTER — Other Ambulatory Visit: Payer: Self-pay

## 2021-08-06 DIAGNOSIS — Z3A12 12 weeks gestation of pregnancy: Secondary | ICD-10-CM | POA: Diagnosis not present

## 2021-08-06 DIAGNOSIS — Z3A16 16 weeks gestation of pregnancy: Secondary | ICD-10-CM | POA: Diagnosis not present

## 2021-08-06 DIAGNOSIS — O364XX1 Maternal care for intrauterine death, fetus 1: Secondary | ICD-10-CM | POA: Diagnosis not present

## 2021-08-06 DIAGNOSIS — O43891 Other placental disorders, first trimester: Secondary | ICD-10-CM | POA: Diagnosis not present

## 2021-08-06 DIAGNOSIS — Z86718 Personal history of other venous thrombosis and embolism: Secondary | ICD-10-CM | POA: Diagnosis not present

## 2021-08-06 DIAGNOSIS — Z7901 Long term (current) use of anticoagulants: Secondary | ICD-10-CM | POA: Diagnosis not present

## 2021-08-06 DIAGNOSIS — O021 Missed abortion: Principal | ICD-10-CM | POA: Diagnosis present

## 2021-08-06 DIAGNOSIS — Z20822 Contact with and (suspected) exposure to covid-19: Secondary | ICD-10-CM | POA: Diagnosis not present

## 2021-08-06 DIAGNOSIS — O364XX0 Maternal care for intrauterine death, not applicable or unspecified: Secondary | ICD-10-CM | POA: Diagnosis present

## 2021-08-06 LAB — RESP PANEL BY RT-PCR (FLU A&B, COVID) ARPGX2
Influenza A by PCR: NEGATIVE
Influenza B by PCR: NEGATIVE
SARS Coronavirus 2 by RT PCR: NEGATIVE

## 2021-08-06 LAB — CBC
HCT: 34.6 % — ABNORMAL LOW (ref 36.0–46.0)
Hemoglobin: 11.9 g/dL — ABNORMAL LOW (ref 12.0–15.0)
MCH: 32.5 pg (ref 26.0–34.0)
MCHC: 34.4 g/dL (ref 30.0–36.0)
MCV: 94.5 fL (ref 80.0–100.0)
Platelets: 267 10*3/uL (ref 150–400)
RBC: 3.66 MIL/uL — ABNORMAL LOW (ref 3.87–5.11)
RDW: 13.1 % (ref 11.5–15.5)
WBC: 5.3 10*3/uL (ref 4.0–10.5)
nRBC: 0 % (ref 0.0–0.2)

## 2021-08-06 LAB — TYPE AND SCREEN
ABO/RH(D): A POS
Antibody Screen: NEGATIVE

## 2021-08-06 MED ORDER — MISOPROSTOL 200 MCG PO TABS
400.0000 ug | ORAL_TABLET | ORAL | Status: DC
  Administered 2021-08-06 – 2021-08-07 (×3): 400 ug via BUCCAL
  Filled 2021-08-06 (×3): qty 2

## 2021-08-06 MED ORDER — LACTATED RINGERS IV SOLN: INTRAVENOUS | Status: DC

## 2021-08-06 MED ORDER — CALCIUM CARBONATE ANTACID 500 MG PO CHEW: 2.0000 | CHEWABLE_TABLET | ORAL | Status: DC | PRN

## 2021-08-06 MED ORDER — MISOPROSTOL 200 MCG PO TABS
800.0000 ug | ORAL_TABLET | Freq: Four times a day (QID) | ORAL | Status: DC
  Administered 2021-08-06 (×2): 800 ug via VAGINAL
  Filled 2021-08-06 (×2): qty 4

## 2021-08-06 MED ORDER — ACETAMINOPHEN 325 MG PO TABS
650.0000 mg | ORAL_TABLET | ORAL | Status: DC | PRN
Start: 2021-08-06 — End: 2021-08-07
  Administered 2021-08-06 – 2021-08-07 (×2): 650 mg via ORAL
  Filled 2021-08-06 (×2): qty 2

## 2021-08-06 MED ORDER — BUTORPHANOL TARTRATE 1 MG/ML IJ SOLN
1.0000 mg | INTRAMUSCULAR | Status: DC | PRN
  Administered 2021-08-06 (×3): 1 mg via INTRAVENOUS
  Filled 2021-08-06 (×3): qty 1

## 2021-08-06 MED ORDER — PRENATAL MULTIVITAMIN CH: 1.0000 | ORAL_TABLET | Freq: Every day | ORAL | Status: DC

## 2021-08-06 MED ORDER — BUTORPHANOL TARTRATE 1 MG/ML IJ SOLN
1.0000 mg | Freq: Once | INTRAMUSCULAR | Status: AC
  Administered 2021-08-06: 1 mg via INTRAVENOUS
  Filled 2021-08-06: qty 1

## 2021-08-06 MED ORDER — ZOLPIDEM TARTRATE 5 MG PO TABS: 5.0000 mg | ORAL_TABLET | Freq: Every evening | ORAL | Status: DC | PRN

## 2021-08-06 MED ORDER — DOCUSATE SODIUM 100 MG PO CAPS: 100.0000 mg | ORAL_CAPSULE | Freq: Every day | ORAL | Status: DC

## 2021-08-06 NOTE — Progress Notes (Signed)
Patient ID: Kathy Tyler, female   DOB: [birth year], 40 y.o.   MRN: [id number] Feeling increased cramping  Minimal bleeding  Cervix 3/80/-1 Fetal parts at os, hard to discern what presenting  Stadol working well, will repeat Repeat buccal cytotec now

## 2021-08-06 NOTE — H&P (Signed)
Kathy Tyler is a 40 y.o. female G72P2010 presenting for IOL with unfortunate finding of fetal demise at 16+ weeks gestation. Pt had come in for routine care and no FHT auscultated with bedside US confirming demise measuring 13-14 weeks.  Prenatal care significant for:  1) Anxiety disorder  Zoloft 50 mg po daily holding well.   2) Advanced maternal age gravida  NIPT low risk   3) History of cesarean section  Prior LTCS x 2 (first arrest of descent, then scheduled repeat)    4) History of deep vein thrombosis  Lovenox 40mg  qd   OB History     Gravida  4   Para  2   Term  2   Preterm      AB  1   Living  2      SAB  1   IAB      Ectopic      Multiple  0   Live Births  2         12-21-2015 1. M, Cesarean Section 01-06-2018 1. F, Cesarean Section Sab X 1  Past Medical History:  Diagnosis Date   Anemia    Anxiety    general and pp   Depression    pp anx and depression   DVT (deep vein thrombosis) in pregnancy    12/18   Hypercholesterolemia    Mental disorder    Past Surgical History:  Procedure Laterality Date   CESAREAN SECTION N/A 12/21/2015   Procedure: CESAREAN SECTION;  Surgeon: 12/23/2015, MD;  Location: [facility];  Service: Obstetrics;  Laterality: N/A;   CESAREAN SECTION N/A 01/06/2018   Procedure: REPEAT CESAREAN SECTION;  Surgeon: 03/08/2018, MD;  Location: [facility] BIRTH[facility] SUIT[facility];  Service: Obstetrics;  Laterality: N/A;  Repeat edc 01/12/18 allergy to tobramycin, ophthalmic solution Tracey RNFA   WISDOM TOOTH EXTRACTION  2007   Family History: family history includes Atrial fibrillation in her mother; Bladder Cancer in her maternal grandmother; Colon cancer in her maternal grandfather; Diabetes in her maternal grandmother; Heart attack in her paternal grandfather; Heart disease in her maternal grandmother and paternal grandfather; Hypertension in her paternal grandfather; Stroke in her maternal grandfather. Social History:  reports  that she has never smoked. She has never used smokeless tobacco. She reports that she does not drink alcohol and does not use drugs.     Maternal Diabetes: No Genetic Screening: Normal \ Review of Systems  Constitutional:  Negative for fever.  Gastrointestinal:  Negative for abdominal pain.  Genitourinary:  Negative for vaginal bleeding.  History Dilation: Closed Exam by:: Dr. [email or web address] Blood pressure 98/60, pulse 72, temperature 98.5 F (36.9 C), temperature source Oral, resp. rate 16, height 5\' 2"  (1.575 m), weight 63.5 kg, SpO2 100 %, unknown if currently breastfeeding. Exam Physical Exam Constitutional:      Appearance: Normal appearance.  Cardiovascular:     Rate and Rhythm: Normal rate and regular rhythm.  Pulmonary:     Effort: Pulmonary effort is normal.  Abdominal:     Palpations: Abdomen is soft.     Comments: Low transverse incision  Neurological:     Mental Status: She is alert.    Prenatal labs: ABO, Rh: --/--/A POS (11/07 ) Antibody: NEG (11/07 0757)   Assessment/Plan: Pt with second trimester fetal demise of unknown etiology.  Given options of D&E vs cytotec IOL and elects the cytotec.  We discussed risk of bleeding necessitating D&C as well as possible retained placenta.  She is on  clears. Plan genetic studies to get full karyotype. Lovenox on hold for > 24 hours   Oliver Pila 08/06/2021, 9:43 AM

## 2021-08-06 NOTE — Progress Notes (Signed)
Patient ID: Kathy Tyler, female   DOB: Apr 23, 1981, 40 y.o.   MRN: [id number] Pt feeling low cramps--stadol controlling, minimal bloody show  Afeb VSS  Cervix 80/1-2/-1, small fetal part felt at os  Will change from vaginal to buccal cytotec now Pt tolerating well.

## 2021-08-06 NOTE — Progress Notes (Signed)
Patient ID: Kathy Tyler, female   DOB: 11/24/1980, 40 y.o.   MRN: [id number] Feeling some cramping,tolerating well Had diarrhea and low grade temp from cytotec  Cervix 50/FT  2nd dose of placed Follow progress Tylenol prn

## 2021-08-07 MED ORDER — SODIUM CHLORIDE 0.9 % IV SOLN
3.0000 g | Freq: Once | INTRAVENOUS | Status: AC
  Administered 2021-08-07: 3 g via INTRAVENOUS
  Filled 2021-08-07: qty 8

## 2021-08-07 MED ORDER — ACETAMINOPHEN 325 MG PO TABS: 650.0000 mg | ORAL_TABLET | ORAL | 1 refills | Status: DC | PRN

## 2021-08-07 NOTE — Progress Notes (Addendum)
Patient ID: Kathy Tyler, female   DOB: 09-Apr-1981, 40 y.o.   MRN: [id number] Pt ambulating and feels fine.  Bleeding minimal since delivery.  Afeb VSS  Fundus firm  Will d/c home  F/u in office in 2 weeks or prn any unusual bleeding Resume lovenox this pm x 4 weeks for h/o DVT

## 2021-08-07 NOTE — Progress Notes (Signed)
Pt discharged after discharge instructions given. All questions answered and pt verbalized understanding. Pt sent home with all belongings in stable condition.  

## 2021-08-07 NOTE — Discharge Summary (Signed)
Physician Discharge Summary  Patient ID: Kathy Tyler MRN: [id number] DOB/AGE: 38-Dec-1982 38 y.o.  Admit date: 08/06/2021 Discharge date: 08/07/2021  Admission Diagnoses: Fetal demise at 16+ weeks gestational age, measuring 13-14 weeks  Discharge Diagnoses:  Active Problems:   Fetal demise before 22 weeks with retention of dead fetus S/p vaginal delivery of fetus  Discharged Condition: good  Hospital Course: Pt admitted for cytotec IOL for retained fetal demise in early second trimester.  Prenatal care significant for h/o DVT and had been on lovenox for that.  NIPT was low risk and at birth fetus appeared to have a tight amniotic band/cord around neck x 4 and constricting it significantly--felt to be the cause of the demise so additional karyotype not performed. Placenta teased out of cervix after delivery and patient given addition of buccal cytotec and one dose of Unasyn.   She was observed for over 6 hours after delivery and bleeding remained very light so was d/c to home.  She will resume her lovenox tonight and continue until 4 weeks postpartum per conversation with hematology  Consults: None  Significant Diagnostic Studies: labs: CBC   Discharge Exam: Blood pressure (!) 97/52, pulse 71, temperature 97.8 F (36.6 C), temperature source Oral, resp. rate 16, height 5\' 2"  (1.575 m), weight 63.5 kg, SpO2 95 %, unknown if currently breastfeeding. General appearance: alert and cooperative GI: soft NT  Disposition: Discharge disposition: 01-Home or Self Care      Discharge Instructions     Diet - low sodium heart healthy   Complete by: As directed    Discharge instructions   Complete by: As directed    Nothing in vagina for 2 weeks, no sex tampons or douching. Call for fever over 100.5, heavy bleeding saturating a maxi pad an hour, or any other concerns      Allergies as of 08/07/2021       Reactions   Tobramycin Rash   Eyelid ointment        Medication  List     STOP taking these medications    ofloxacin 0.3 % ophthalmic solution Commonly known as: OCUFLOX       TAKE these medications    acetaminophen 325 MG tablet Commonly known as: TYLENOL Take 2 tablets (650 mg total) by mouth every 4 (four) hours as needed (for pain scale < 4  OR  temperature  >/=  100.5 F).   clotrimazole 1 % external solution Commonly known as: LOTRIMIN Apply 1 application topically 2 (two) times daily as needed (athletes foot).   enoxaparin 60 MG/0.6ML injection Commonly known as: LOVENOX Inject 0.6 mLs (60 mg total) into the skin every 12 (twelve) hours.   fexofenadine 180 MG tablet Commonly known as: ALLEGRA Take 180 mg by mouth at bedtime.   fluticasone 50 MCG/ACT nasal spray Commonly known as: FLONASE Place 1 spray into both nostrils at bedtime.   Prenate Mini 18-0.6-0.4-350 MG Caps Take 1 tablet by mouth every evening.   sertraline 50 MG tablet Commonly known as: ZOLOFT Take 50 mg by mouth at bedtime.        Follow-up Information     06-09-1980, MD. Schedule an appointment as soon as possible for a visit in 2 week(s).   Specialty: Obstetrics and Gynecology Why: Office appointment to followup after delivery Contact information: [address] [place] [phone]                 Signed: [phone]  08/07/2021, 6:06 AM

## 2021-08-07 NOTE — Progress Notes (Signed)
Dr. Senaida Ores in room to assess pt. status. MD assessed that pt. was ready to push. Fetus was delivered at 0135 and placenta was delivered at 0145. MD performed speculum for parts. Pt. stable and bleeding minimal at this time. Buccal Cytotec 400 mcg was given at 0155. Will continue with routine fundal exams for post vaginal recovery.

## 2021-08-07 NOTE — Progress Notes (Addendum)
Patient ID: Kathy Tyler, female   DOB: Feb 07, 1981, 40 y.o.   MRN: [id number]  Delivery note  Pt having moderate cramping and time for next cytotec.  Examined and baby in vagina en caul.  Pt instructed to push and sac delivered intact with very thin cord trailing up to placenta.  Placenta partially delivering from cervix. Baby handed off to nurse and pt pushed with placenta still partially delivered through os but not able to be pushed out.  Pt placed on bedpan and speculum placed.  Placenta grasped with ring forcep and large portion teased out of cervix and delivered.  Bleeding still fairly minimal.    Pt and father allowed to spend time with baby and I examined him with them.  Once amniotic sac opened, the cord with possible adherent amniotic band was noted to be tightly adherent around the fetal neck x 4.  This was reduced and the fetus appeared normal otherwise.  Although rare, I feel this was the cause of the fetal demise given the distortion of the neck and head from the band.  Pt had a normal NIPT and she declines any further genetic testing given cause seems apparent.   Recheck after 15 minutes with mild bleeding,  although cervix still palpates open to a fingertip.  Pt given another of buccal cytotec to see if any further tissue passes.  If bleeding becomes heavy will take to OR for D&C, but it appeared the majority of the placenta if not all delivered.    D/w pt sending baby to pathology vs private cremation and she prefers to send to pathology.

## 2021-08-08 LAB — SURGICAL PATHOLOGY

## 2022-03-07 IMAGING — US US EXTREM LOW VENOUS*R*
1 series · 14 of 24 positions shown · non-contrast
Comparison: None.

CLINICAL DATA: Right knee pain x2 weeks, history of DVT, on Lovenox

EXAM:
RIGHT LOWER EXTREMITY VENOUS DOPPLER ULTRASOUND
TECHNIQUE: Gray-scale sonography with compression, as well as color and duplex
ultrasound, were performed to evaluate the deep venous system(s)
from the level of the common femoral vein through the popliteal and
proximal calf veins.

[Series 1: us venous img lower uni right (dvt) · portal-venous · 14 of 59 slices shown]
[im 1/59]
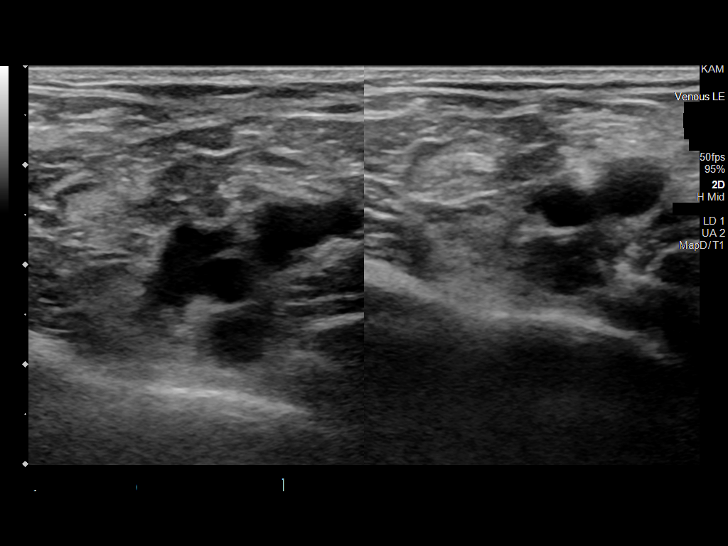
[im 6/59]
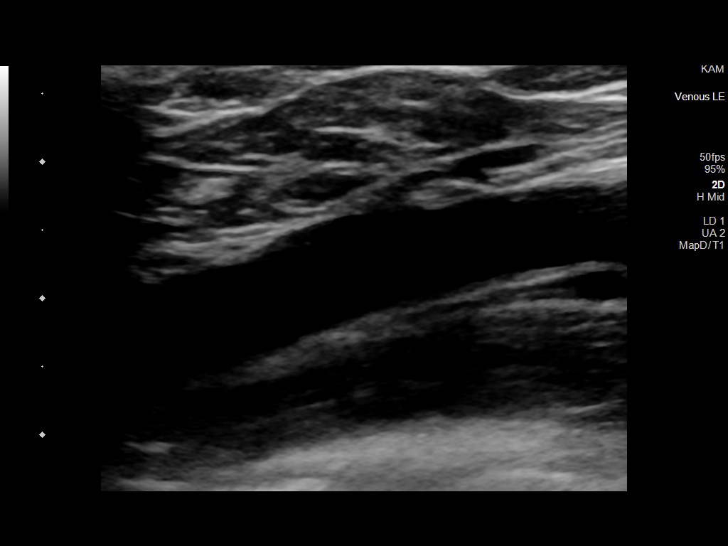
[im 11/59]
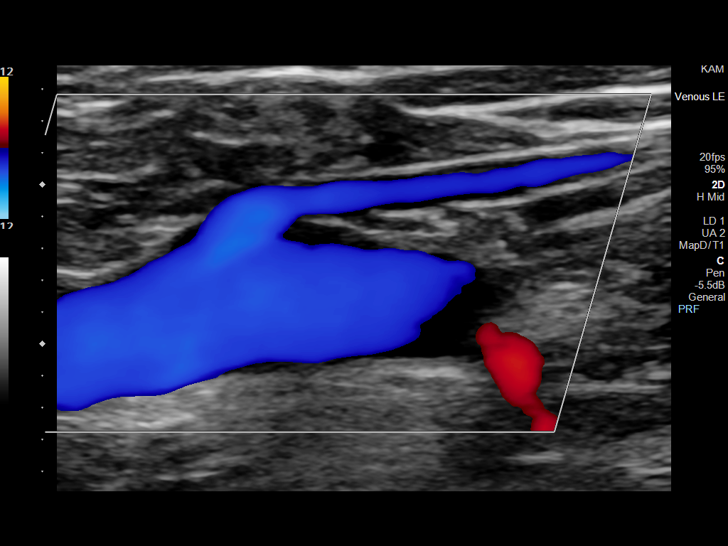
[im 16/59]
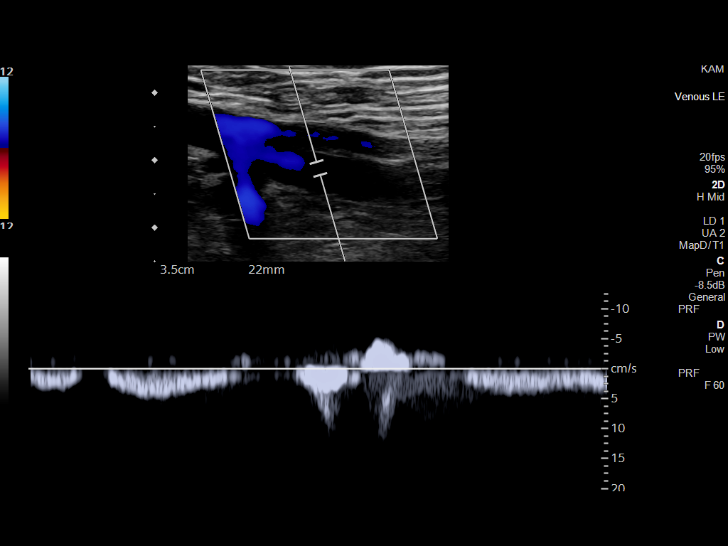
[im 18/59]
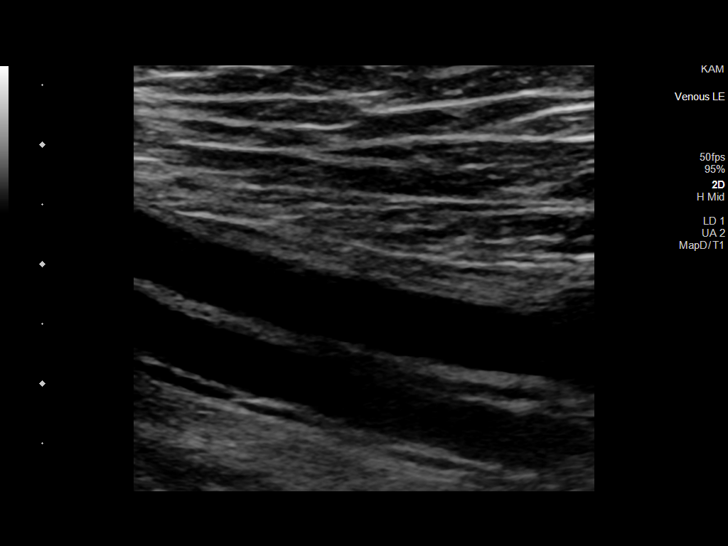
[im 23/59]
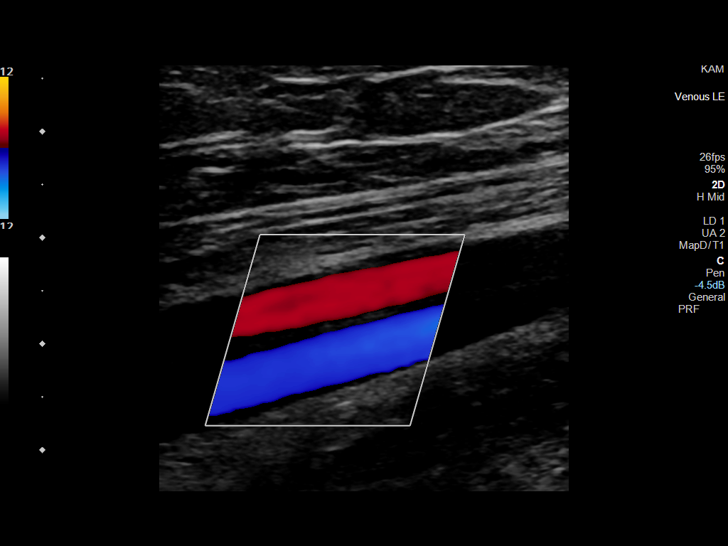
[im 28/59]
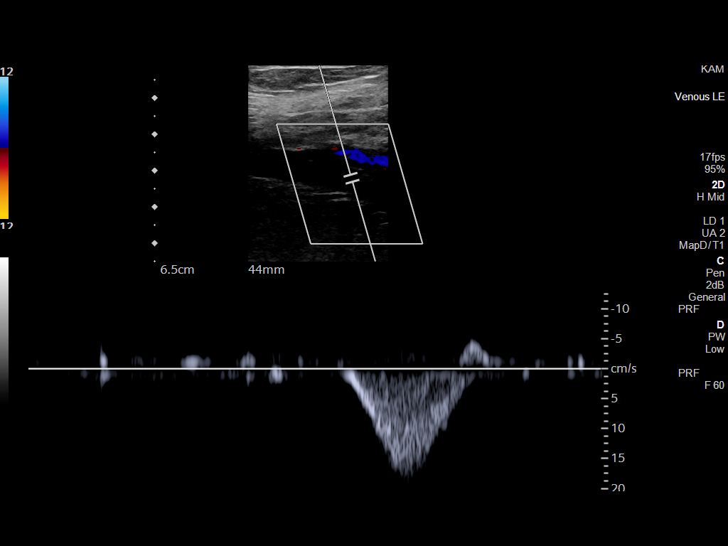
[im 31/59]
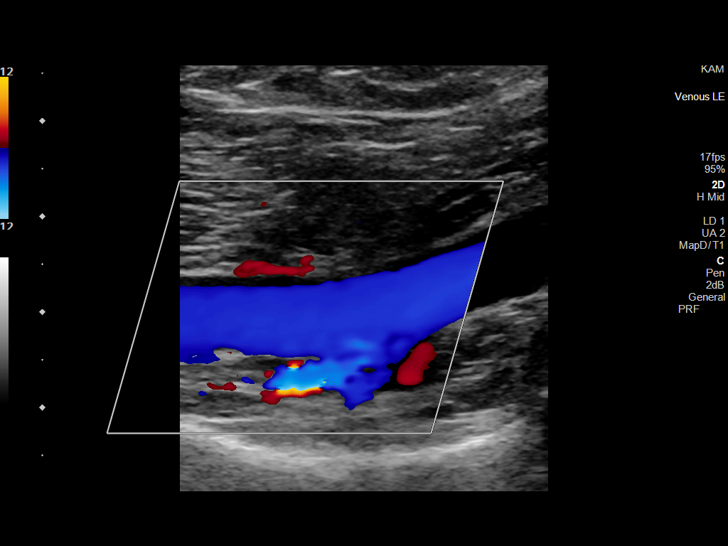
[im 36/59]
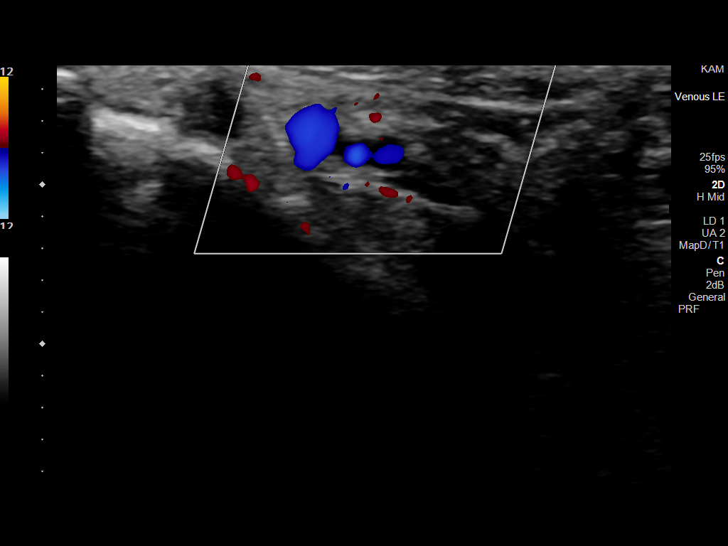
[im 41/59]
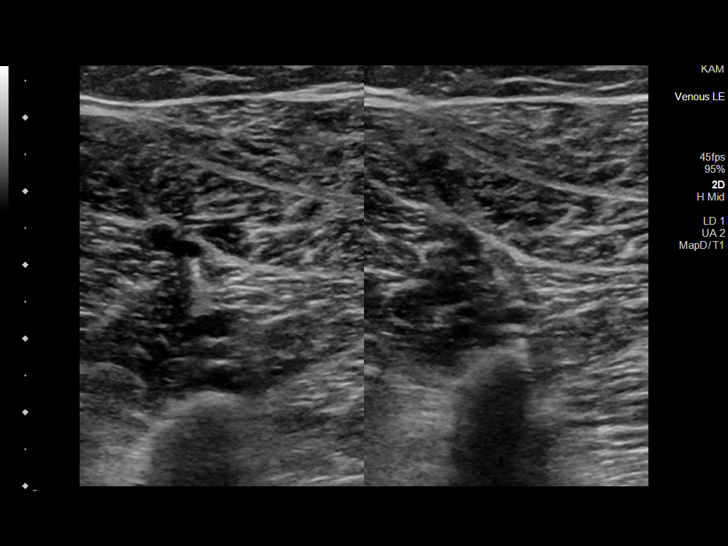
[im 46/59]
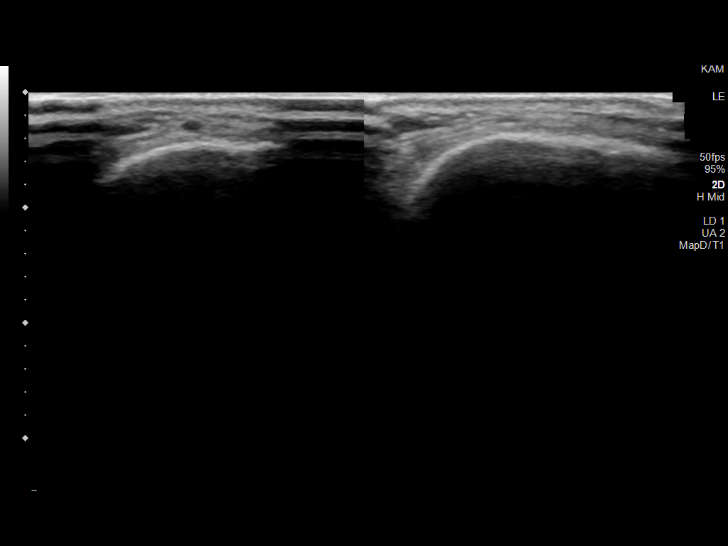
[im 48/59]
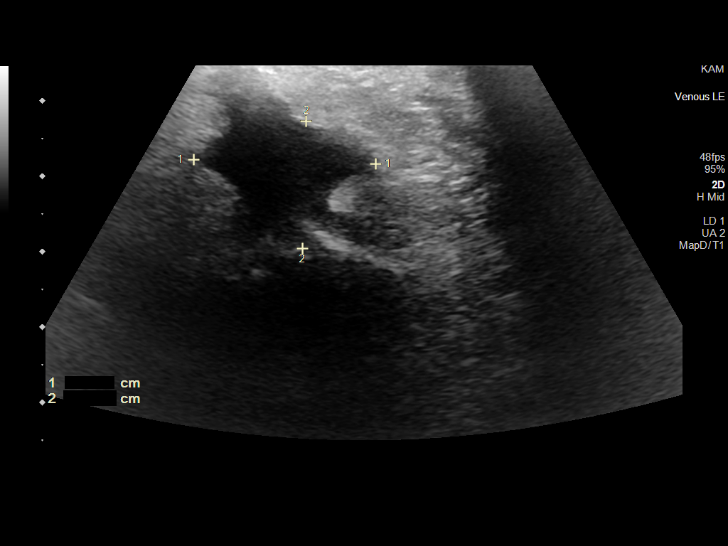
[im 53/59]
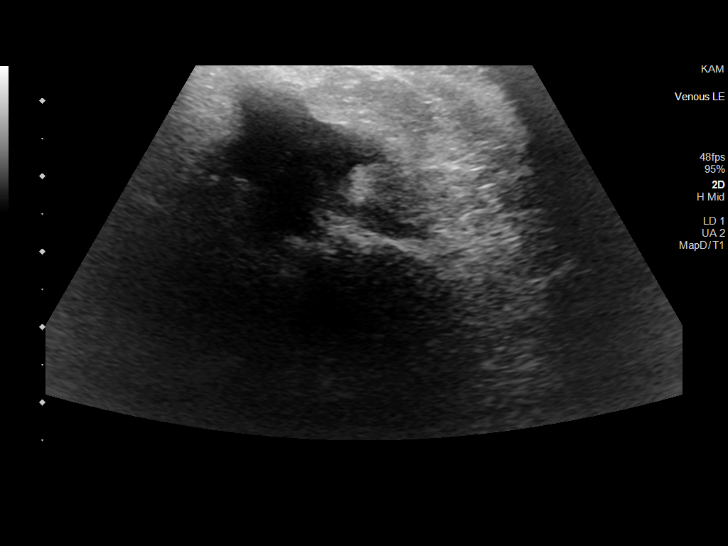
[im 59/59]
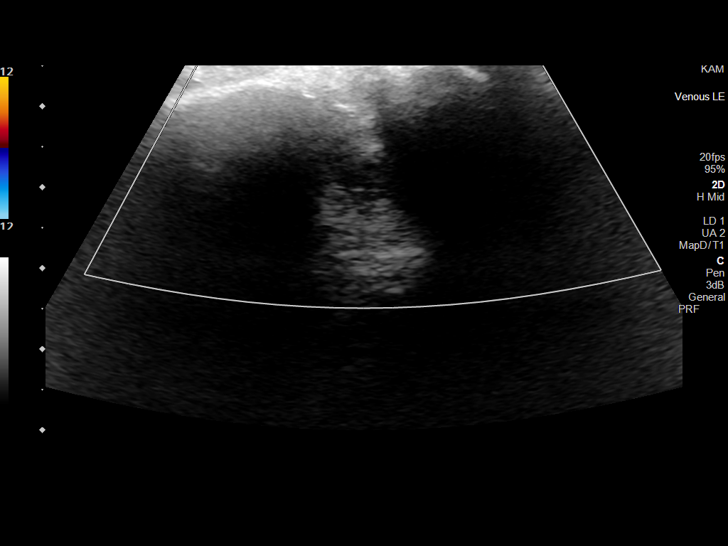

[14 of 24 positions shown; findings below may reference images not displayed]

FINDINGS: VENOUS

Normal compressibility of the common femoral, superficial femoral,
and popliteal veins, as well as the visualized calf veins.
Visualized portions of profunda femoral vein and great saphenous
vein unremarkable. No filling defects to suggest DVT on grayscale or
color Doppler imaging. Doppler waveforms show normal direction of
venous flow, normal respiratory phasicity and response to
augmentation.

Limited views of the contralateral common femoral vein are
unremarkable.

OTHER

2.4 cm deep subcutaneous fluid collection inferior to the patella.

Limitations: none
IMPRESSION: 1. Negative for right lower extremity DVT.
2. Right infrapatellar fluid collection, incompletely characterized.
If symptoms persist, knee MR Works be useful for further evaluation

## 2022-05-08 DIAGNOSIS — Z13220 Encounter for screening for lipoid disorders: Secondary | ICD-10-CM | POA: Diagnosis not present

## 2022-05-08 DIAGNOSIS — R002 Palpitations: Secondary | ICD-10-CM | POA: Diagnosis not present

## 2022-05-08 DIAGNOSIS — I459 Conduction disorder, unspecified: Secondary | ICD-10-CM | POA: Diagnosis not present

## 2022-06-09 DIAGNOSIS — Z6822 Body mass index (BMI) 22.0-22.9, adult: Secondary | ICD-10-CM | POA: Diagnosis not present

## 2022-06-09 DIAGNOSIS — J020 Streptococcal pharyngitis: Secondary | ICD-10-CM | POA: Diagnosis not present

## 2022-06-09 DIAGNOSIS — J029 Acute pharyngitis, unspecified: Secondary | ICD-10-CM | POA: Diagnosis not present

## 2022-06-17 ENCOUNTER — Encounter: Payer: Self-pay | Admitting: Cardiology

## 2022-06-17 ENCOUNTER — Ambulatory Visit: Payer: BC Managed Care – PPO | Attending: Cardiology | Admitting: Cardiology

## 2022-06-17 VITALS — BP 100/74 | HR 65 | Ht 62.0 in | Wt 129.0 lb

## 2022-06-17 DIAGNOSIS — R002 Palpitations: Secondary | ICD-10-CM

## 2022-06-17 DIAGNOSIS — Z86718 Personal history of other venous thrombosis and embolism: Secondary | ICD-10-CM | POA: Diagnosis not present

## 2022-06-17 DIAGNOSIS — Z8759 Personal history of other complications of pregnancy, childbirth and the puerperium: Secondary | ICD-10-CM

## 2022-06-17 DIAGNOSIS — E7800 Pure hypercholesterolemia, unspecified: Secondary | ICD-10-CM

## 2022-06-17 DIAGNOSIS — Z98891 History of uterine scar from previous surgery: Secondary | ICD-10-CM

## 2022-06-17 NOTE — Patient Instructions (Signed)
Medication Instructions:  The current medical regimen is effective;  continue present plan and medications.  *If you need a refill on your cardiac medications before your next appointment, please call your pharmacy*   Follow-Up: At [facility], you and your health needs are our priority.  As part of our continuing mission to provide you with exceptional heart care, we have created designated Provider Care Teams.  These Care Teams include your primary Cardiologist (physician) and Advanced Practice Providers (APPs -  Physician Assistants and Nurse Practitioners) who all work together to provide you with the care you need, when you need it.  We recommend signing up for the patient portal called "MyChart".  Sign up information is provided on this After Visit Summary.  MyChart is used to connect with patients for Virtual Visits (Telemedicine).  Patients are able to view lab/test results, encounter notes, upcoming appointments, etc.  Non-urgent messages can be sent to your provider as well.   To learn more about what you can do with MyChart, go to [email or web address].    Your next appointment:   Follow up as needed with Dr Skains   Important Information About Sugar       

## 2022-06-17 NOTE — Progress Notes (Signed)
Cardiology Office Note:    Date:  06/17/2022   ID:  Kathy Tyler, DOB [birth year], MRN [id number]  PCP:  Zelphia Cairo, MD   [facility] Providers Cardiologist:  Donato Schultz, MD     Referring MD: Laurann Montana, MD    History of Present Illness:    Kathy Tyler is a 41 y.o. female here for the evaluation of palpitations at the request of Dr. Cliffton Asters.  Her mother has recently been diagnosed with atrial fibrillation.  When she was drinking more caffeine, she would feel some palpitations.  She has decreased her caffeine usage and this seems to have helped. Cough racing beats goes away. Apple watch once with ECG said AFIB. PCP next day ECG at PCP. Stopped caffiene.   Denies any fevers chills nausea vomiting syncope bleeding.  Blood clot with second child.  Reviewed oncology note from 02/10/2018 from Dr. Candise Che.  Extensive work-up was unremarkable including negative antiphospholipid antibodies, prothrombin gene mutation.  Thought it was hormonal related.  She is to use prophylactic Lovenox during pregnancy.  She also states that she has had hyperlipidemia.  Her prior LDL was 171 total cholesterol 248 HDL 60.  Creatinine 0.7 hemoglobin A1c 5.0 TSH 1.2.  Grandparents have had heart issues in the past.  Outside office notes reviewed.  Prior heart monitor personally reviewed.  Lab work reviewed.  Oncology notes reviewed.  Past Medical History:  Diagnosis Date   Anemia    Anxiety    general and pp   Depression    pp anx and depression   DVT (deep vein thrombosis) in pregnancy    12/18   Dyslipidemia    Hypercholesterolemia    Mental disorder    Skipped heart beats     Past Surgical History:  Procedure Laterality Date   CESAREAN SECTION N/A 12/21/2015   Procedure: CESAREAN SECTION;  Surgeon: Zelphia Cairo, MD;  Location: [facility];  Service: Obstetrics;  Laterality: N/A;   CESAREAN SECTION N/A 01/06/2018   Procedure: REPEAT CESAREAN SECTION;  Surgeon:  Zelphia Cairo, MD;  Location: [facility] BIRTHING SUITES;  Service: Obstetrics;  Laterality: N/A;  Repeat edc 01/12/18 allergy to tobramycin, ophthalmic solution Tracey RNFA   WISDOM TOOTH EXTRACTION  2007    Current Medications: Current Meds  Medication Sig   clotrimazole (LOTRIMIN) 1 % external solution Apply 1 application topically 2 (two) times daily as needed (athletes foot).   fexofenadine (ALLEGRA) 180 MG tablet Take 180 mg by mouth at bedtime.   fluticasone (FLONASE) 50 MCG/ACT nasal spray Place 1 spray into both nostrils at bedtime.   sertraline (ZOLOFT) 50 MG tablet Take 50 mg by mouth at bedtime.     Allergies:   Tobramycin   Social History   Socioeconomic History   Marital status: Married    Spouse name: Not on file   Number of children: Not on file   Years of education: Not on file   Highest education level: Not on file  Occupational History   Not on file  Tobacco Use   Smoking status: Never   Smokeless tobacco: Never  Vaping Use   Vaping Use: Never used  Substance and Sexual Activity   Alcohol use: No   Drug use: No   Sexual activity: Yes    Birth control/protection: None  Other Topics Concern   Not on file  Social History Narrative   Not on file   Social Determinants of Health   Financial Resource Strain: Not on file  Food Insecurity: Not on file  Transportation Needs: Not on file  Physical Activity: Not on file  Stress: Not on file  Social Connections: Not on file     Family History: The patient's family history includes Atrial fibrillation in her mother; Bladder Cancer in her maternal grandmother; Colon cancer in her maternal grandfather; Diabetes in her maternal grandmother; Heart attack in her paternal grandfather; Heart disease in her maternal grandmother and paternal grandfather; Hypertension in her paternal grandfather; Stroke in her maternal grandfather.  ROS:   Please see the history of present illness.     All other systems reviewed and are  negative.  EKGs/Labs/Other Studies Reviewed:    The following studies were reviewed today: [employer] monitor 2021: HR  avg 79  Min 52-Max 141  PVCs Rare, less than 1%   PACs Rare, less than 1%     Conclusions No arrhythmias detected   EKG:  EKG is  ordered today.  The ekg ordered today demonstrates sinus rhythm 65 no other abnormalities.  Normal intervals.  Recent Labs: 08/06/2021: Hemoglobin 11.9; Platelets 267  Recent Lipid Panel No results found for: "CHOL", "TRIG", "HDL", "CHOLHDL", "VLDL", "LDLCALC", "LDLDIRECT"   Risk Assessment/Calculations:              Physical Exam:    VS:  BP 100/74 (BP Location: Left Arm, Patient Position: Sitting, Cuff Size: Normal)   Pulse 65   Ht 5\' 2"  (1.575 m)   Wt 129 lb (58.5 kg)   SpO2 99%   BMI 23.59 kg/m     Wt Readings from Last 3 Encounters:  06/17/22 129 lb (58.5 kg)  08/06/21 140 lb (63.5 kg)  05/11/21 125 lb (56.7 kg)     GEN:  Well nourished, well developed in no acute distress HEENT: Normal NECK: No JVD; No carotid bruits LYMPHATICS: No lymphadenopathy CARDIAC: RRR, no murmurs, no rubs, gallops RESPIRATORY:  Clear to auscultation without rales, wheezing or rhonchi  ABDOMEN: Soft, non-tender, non-distended MUSCULOSKELETAL:  No edema; No deformity  SKIN: Warm and dry NEUROLOGIC:  Alert and oriented x 3 PSYCHIATRIC:  Normal affect   ASSESSMENT:    1. Palpitations   2. S/P cesarean section   3. Pure hypercholesterolemia   4. History of deep vein thrombosis (DVT) during pregnancy    PLAN:    In order of problems listed above:  Palpitations - Thankfully, these have subsided with cessation of caffeine.  Encouraged her to continue with caffeine cessation.  Doing very well. -Reviewed prior monitor.  Rare PVCs PACs.  No evidence of atrial fibrillation.  If symptoms were to become more worrisome to her, we can always send her out a ZIO 14-day monitor if necessary.  For now, continue with conservative management  strategies.  DVT/thrombosis during pregnancy - Reviewed oncology note as above.  Prophylactic Lovenox during pregnancy recommended.  No evidence of lupus anticoagulant or antiphospholipid antibody syndrome.  Hyperlipidemia - LDL 171.  She is working on diet and exercise.  Likely genetic factors.  She could consider at approximately age 49 coronary calcium score, $99 test to help determine whether or not there is any evidence of calcified plaque present.  If there is calcified plaque, would encourage in addition to diet and exercise, statin therapy such as Crestor.  If not, may consider repeating the scan in 5 years to see if there is any change.  She does have a family history of heart disease.     Medication Adjustments/Labs and Tests Ordered: Current medicines are  reviewed at length with the patient today.  Concerns regarding medicines are outlined above.  Orders Placed This Encounter  Procedures   EKG 12-Lead   No orders of the defined types were placed in this encounter.   Patient Instructions  Medication Instructions:  The current medical regimen is effective;  continue present plan and medications.  *If you need a refill on your cardiac medications before your next appointment, please call your pharmacy*  Follow-Up: At [facility], you and your health needs are our priority.  As part of our continuing mission to provide you with exceptional heart care, we have created designated Provider Care Teams.  These Care Teams include your primary Cardiologist (physician) and Advanced Practice Providers (APPs -  Physician Assistants and Nurse Practitioners) who all work together to provide you with the care you need, when you need it.  We recommend signing up for the patient portal called "MyChart".  Sign up information is provided on this After Visit Summary.  MyChart is used to connect with patients for Virtual Visits (Telemedicine).  Patients are able to view lab/test results,  encounter notes, upcoming appointments, etc.  Non-urgent messages can be sent to your provider as well.   To learn more about what you can do with MyChart, go to [email or web address].    Your next appointment:   Follow up as needed with Dr Marlou Porch   Important Information About Sugar         Signed, Candee Furbish, MD  06/17/2022 10:22 AM    [place]

## 2022-07-17 DIAGNOSIS — Z130 Encounter for screening for diseases of the blood and blood-forming organs and certain disorders involving the immune mechanism: Secondary | ICD-10-CM | POA: Diagnosis not present

## 2022-07-17 DIAGNOSIS — Z01419 Encounter for gynecological examination (general) (routine) without abnormal findings: Secondary | ICD-10-CM | POA: Diagnosis not present

## 2022-07-17 DIAGNOSIS — Z1389 Encounter for screening for other disorder: Secondary | ICD-10-CM | POA: Diagnosis not present

## 2022-07-17 DIAGNOSIS — Z1231 Encounter for screening mammogram for malignant neoplasm of breast: Secondary | ICD-10-CM | POA: Diagnosis not present

## 2022-08-05 DIAGNOSIS — E785 Hyperlipidemia, unspecified: Secondary | ICD-10-CM | POA: Diagnosis not present

## 2022-10-17 DIAGNOSIS — J029 Acute pharyngitis, unspecified: Secondary | ICD-10-CM | POA: Diagnosis not present

## 2023-01-28 DIAGNOSIS — R059 Cough, unspecified: Secondary | ICD-10-CM | POA: Diagnosis not present

## 2023-01-28 DIAGNOSIS — R519 Headache, unspecified: Secondary | ICD-10-CM | POA: Diagnosis not present

## 2023-01-28 DIAGNOSIS — Z20822 Contact with and (suspected) exposure to covid-19: Secondary | ICD-10-CM | POA: Diagnosis not present

## 2023-01-28 DIAGNOSIS — B349 Viral infection, unspecified: Secondary | ICD-10-CM | POA: Diagnosis not present

## 2023-01-28 DIAGNOSIS — R509 Fever, unspecified: Secondary | ICD-10-CM | POA: Diagnosis not present

## 2023-01-29 DIAGNOSIS — B9689 Other specified bacterial agents as the cause of diseases classified elsewhere: Secondary | ICD-10-CM | POA: Diagnosis not present

## 2023-01-29 DIAGNOSIS — J019 Acute sinusitis, unspecified: Secondary | ICD-10-CM | POA: Diagnosis not present

## 2023-08-01 DIAGNOSIS — Z23 Encounter for immunization: Secondary | ICD-10-CM | POA: Diagnosis not present

## 2023-08-01 DIAGNOSIS — Z3201 Encounter for pregnancy test, result positive: Secondary | ICD-10-CM | POA: Diagnosis not present

## 2023-08-20 DIAGNOSIS — Z113 Encounter for screening for infections with a predominantly sexual mode of transmission: Secondary | ICD-10-CM | POA: Diagnosis not present

## 2023-08-20 DIAGNOSIS — Z3A09 9 weeks gestation of pregnancy: Secondary | ICD-10-CM | POA: Diagnosis not present

## 2023-08-20 DIAGNOSIS — O26891 Other specified pregnancy related conditions, first trimester: Secondary | ICD-10-CM | POA: Diagnosis not present

## 2023-08-20 DIAGNOSIS — Z3689 Encounter for other specified antenatal screening: Secondary | ICD-10-CM | POA: Diagnosis not present

## 2023-08-20 DIAGNOSIS — O09521 Supervision of elderly multigravida, first trimester: Secondary | ICD-10-CM | POA: Diagnosis not present

## 2023-08-20 LAB — OB RESULTS CONSOLE GC/CHLAMYDIA
Chlamydia: NEGATIVE
Neisseria Gonorrhea: NEGATIVE

## 2023-08-20 LAB — OB RESULTS CONSOLE RUBELLA ANTIBODY, IGM: Rubella: IMMUNE

## 2023-08-20 LAB — OB RESULTS CONSOLE ANTIBODY SCREEN: Antibody Screen: NEGATIVE

## 2023-08-20 LAB — OB RESULTS CONSOLE HIV ANTIBODY (ROUTINE TESTING): HIV: NONREACTIVE

## 2023-08-20 LAB — OB RESULTS CONSOLE RPR: RPR: NONREACTIVE

## 2023-08-20 LAB — OB RESULTS CONSOLE HEPATITIS B SURFACE ANTIGEN: Hepatitis B Surface Ag: NEGATIVE

## 2023-08-20 LAB — HEPATITIS C ANTIBODY: HCV Ab: NEGATIVE

## 2023-09-08 DIAGNOSIS — O09521 Supervision of elderly multigravida, first trimester: Secondary | ICD-10-CM | POA: Diagnosis not present

## 2023-09-16 DIAGNOSIS — R319 Hematuria, unspecified: Secondary | ICD-10-CM | POA: Diagnosis not present

## 2023-10-27 DIAGNOSIS — Z363 Encounter for antenatal screening for malformations: Secondary | ICD-10-CM | POA: Diagnosis not present

## 2023-10-27 DIAGNOSIS — I82409 Acute embolism and thrombosis of unspecified deep veins of unspecified lower extremity: Secondary | ICD-10-CM | POA: Diagnosis not present

## 2023-10-27 DIAGNOSIS — Z3A19 19 weeks gestation of pregnancy: Secondary | ICD-10-CM | POA: Diagnosis not present

## 2023-10-31 ENCOUNTER — Other Ambulatory Visit (INDEPENDENT_AMBULATORY_CARE_PROVIDER_SITE_OTHER): Payer: 59

## 2023-10-31 ENCOUNTER — Ambulatory Visit (INDEPENDENT_AMBULATORY_CARE_PROVIDER_SITE_OTHER): Payer: 59 | Admitting: Cardiology

## 2023-10-31 ENCOUNTER — Other Ambulatory Visit (HOSPITAL_COMMUNITY): Payer: Self-pay

## 2023-10-31 VITALS — Ht 62.0 in | Wt 134.2 lb

## 2023-10-31 DIAGNOSIS — Z8759 Personal history of other complications of pregnancy, childbirth and the puerperium: Secondary | ICD-10-CM | POA: Diagnosis not present

## 2023-10-31 DIAGNOSIS — Z7901 Long term (current) use of anticoagulants: Secondary | ICD-10-CM | POA: Diagnosis not present

## 2023-10-31 DIAGNOSIS — R002 Palpitations: Secondary | ICD-10-CM | POA: Diagnosis not present

## 2023-10-31 DIAGNOSIS — Z3A19 19 weeks gestation of pregnancy: Secondary | ICD-10-CM | POA: Diagnosis not present

## 2023-10-31 DIAGNOSIS — Z86718 Personal history of other venous thrombosis and embolism: Secondary | ICD-10-CM

## 2023-10-31 MED ORDER — BLOOD PRESSURE MONITOR AUTOMAT DEVI
1.0000 [IU] | Freq: Once | 0 refills | Status: AC
  Filled 2023-10-31: qty 1, 30d supply, fill #0

## 2023-10-31 NOTE — Patient Instructions (Addendum)
Medication Instructions:  Your physician recommends that you continue on your current medications as directed. Please refer to the Current Medication list given to you today.  *If you need a refill on your cardiac medications before your next appointment, please call your pharmacy*   Testing/Procedures: ZIO XT- Long Term Monitor Instructions  Your physician has requested you wear a ZIO patch monitor for 14 days.  This is a single patch monitor. [employer] supplies one patch monitor per enrollment. Additional stickers are not available. Please do not apply patch if you will be having a Nuclear Stress Test,  Echocardiogram, Cardiac CT, MRI, or Chest Xray during the period you would be wearing the  monitor. The patch cannot be worn during these tests. You cannot remove and re-apply the  ZIO XT patch monitor.  Your ZIO patch monitor will be mailed 3 day USPS to your address on file. It may take 3-5 days  to receive your monitor after you have been enrolled.  Once you have received your monitor, please review the enclosed instructions. Your monitor  has already been registered assigning a specific monitor serial # to you.  Billing and Patient Assistance Program Information  We have supplied [employer] with any of your insurance information on file for billing purposes. [employer] offers a sliding scale Patient Assistance Program for patients that do not have  insurance, or whose insurance does not completely cover the cost of the ZIO monitor.  You must apply for the Patient Assistance Program to qualify for this discounted rate.  To apply, please call [employer] at [phone], select option 4, select option 2, ask to apply for  Patient Assistance Program. Meredeth Ide will ask your household income, and how many people  are in your household. They will quote your out-of-pocket cost based on that information.  [employer] will also be able to set up a 85-month, interest-free payment plan if needed.  Applying  the monitor   Shave hair from upper left chest.  Hold abrader disc by orange tab. Rub abrader in 40 strokes over the upper left chest as  indicated in your monitor instructions.  Clean area with 4 enclosed alcohol pads. Let dry.  Apply patch as indicated in monitor instructions. Patch will be placed under collarbone on left  side of chest with arrow pointing upward.  Rub patch adhesive wings for 2 minutes. Remove white label marked "1". Remove the white  label marked "2". Rub patch adhesive wings for 2 additional minutes.  While looking in a mirror, press and release button in center of patch. A small green light will  flash 3-4 times. This will be your only indicator that the monitor has been turned on.  Do not shower for the first 24 hours. You may shower after the first 24 hours.  Press the button if you feel a symptom. You will hear a small click. Record Date, Time and  Symptom in the Patient Logbook.  When you are ready to remove the patch, follow instructions on the last 2 pages of Patient  Logbook. Stick patch monitor onto the last page of Patient Logbook.  Place Patient Logbook in the blue and white box. Use locking tab on box and tape box closed  securely. The blue and white box has prepaid postage on it. Please place it in the mailbox as  soon as possible. Your physician should have your test results approximately 7 days after the  monitor has been mailed back to [facility].  Call [facility]  at [phone] if you have questions regarding  your ZIO XT patch monitor. Call them immediately if you see an orange light blinking on your  monitor.  If your monitor falls off in less than 4 days, contact our Monitor department at [phone].  If your monitor becomes loose or falls off after 4 days call [employer] at [phone] for  suggestions on securing your monitor    Follow-Up: At [facility], you and your health needs are our priority.  As part  of our continuing mission to provide you with exceptional heart care, we have created designated Provider Care Teams.  These Care Teams include your primary Cardiologist (physician) and Advanced Practice Providers (APPs -  Physician Assistants and Nurse Practitioners) who all work together to provide you with the care you need, when you need it.   Your next appointment:   April 10th at 8:40 am via MyChart   Provider:   Thomasene Ripple, DO   Other Instructions:

## 2023-11-02 NOTE — Progress Notes (Signed)
Cardio-Obstetrics Clinic  New Evaluation  Date:  11/02/2023   ID:  Kathy Tyler, DOB [birth year], MRN [id number]  PCP:  Zelphia Cairo, MD    HeartCare Providers Cardiologist:  Donato Schultz, MD  Electrophysiologist:  None       Referring MD: Carlisle Cater, MD   Chief Complaint: "I am having palpitations"  History of Present Illness:    Kathy Tyler is a 43 y.o. female [[id number]] who is being seen today for the evaluation of palpitations at the request of Shivaji, Valerie Roys, MD.   Medical  a history of premature atrial contractions (PACs), presents with increased palpitations during her current pregnancy. She first noticed heart "complications" and "flip-flopping" a few years ago, which led to a diagnosis of PACs after wearing a heart monitor. About a year later, after a day of heavy caffeine intake, she experienced an episode of heart racing, which her Apple watch suggested might be AFib. Since then, she has significantly reduced her caffeine intake and experienced minimal palpitations until her current pregnancy.  Despite avoiding caffeine, she has been experiencing palpitations more frequently during her pregnancy, occurring about once or twice a week. These episodes are brief and resolve quickly, and she has not used her Apple watch to record an EKG during these episodes. She also reports shortness of breath, which she is unsure whether to attribute to her pregnancy or a cardiac issue.  In addition to her cardiac concerns, the patient has a history of deep vein thrombosis (DVT) during a previous pregnancy and is currently on Lovenox. She is also scheduled for a C-section for her current pregnancy, having had C-sections for her two previous deliveries.  Prior CV Studies Reviewed: The following studies were reviewed today: Prior zio  Past Medical History:  Diagnosis Date   Anemia    Anxiety    general and pp   Depression    pp anx  and depression   DVT (deep vein thrombosis) in pregnancy    12/18   Dyslipidemia    Hypercholesterolemia    Mental disorder    Skipped heart beats     Past Surgical History:  Procedure Laterality Date   CESAREAN SECTION N/A 12/21/2015   Procedure: CESAREAN SECTION;  Surgeon: Zelphia Cairo, MD;  Location: [facility];  Service: Obstetrics;  Laterality: N/A;   CESAREAN SECTION N/A 01/06/2018   Procedure: REPEAT CESAREAN SECTION;  Surgeon: Zelphia Cairo, MD;  Location: [facility]IRTH[facility] SU[facility];  Service: Obstetrics;  Laterality: N/A;  Repeat edc 01/12/18 allergy to tobramycin, ophthalmic solution Tracey RNFA   WISDOM TOOTH EXTRACTION  2007      OB History     Gravida  4   Para  2   Term  2   Preterm      AB  1   Living  2      SAB  1   IAB      Ectopic      Multiple  0   Live Births  2               Current Medications: Current Meds  Medication Sig   [EXPIRED] Blood Pressure Monitoring (BLOOD PRESSURE MONITOR AUTOMAT) DEVI Use as directed to check blood pressure.     Allergies:   Tobramycin   Social History   Socioeconomic History   Marital status: Married    Spouse name: Not on file   Number of children: Not on file   Years of education:  Not on file   Highest education level: Not on file  Occupational History   Not on file  Tobacco Use   Smoking status: Never   Smokeless tobacco: Never  Vaping Use   Vaping status: Never Used  Substance and Sexual Activity   Alcohol use: No   Drug use: No   Sexual activity: Yes    Birth control/protection: None  Other Topics Concern   Not on file  Social History Narrative   Not on file   Social Drivers of Health   Financial Resource Strain: Not on file  Food Insecurity: No Food Insecurity (11/02/2023)   Hunger Vital Sign    Worried About Running Out of Food in the Last Year: Never true    Ran Out of Food in the Last Year: Never true  Transportation Needs: No Transportation Needs (11/02/2023)   PRAPARE -  Administrator, Civil Service (Medical): No    Lack of Transportation (Non-Medical): No  Physical Activity: Not on file  Stress: Not on file  Social Connections: Not on file      Family History  Problem Relation Age of Onset   Heart disease Maternal Grandmother    Diabetes Maternal Grandmother    Bladder Cancer Maternal Grandmother    Stroke Maternal Grandfather    Colon cancer Maternal Grandfather    Heart disease Paternal Grandfather    Heart attack Paternal Grandfather    Hypertension Paternal Grandfather    Atrial fibrillation Mother       ROS:   Please see the history of present illness.    Palpitations All other systems reviewed and are negative.   Labs/EKG Reviewed:    EKG:   EKG was not ordered today.   Recent Labs: No results found for requested labs within last 365 days.   Recent Lipid Panel No results found for: "CHOL", "TRIG", "HDL", "CHOLHDL", "LDLCALC", "LDLDIRECT"  Physical Exam:    VS:  Ht 5\' 2"  (1.575 m)   Wt 134 lb 3.2 oz (60.9 kg)   BMI 24.55 kg/m     Wt Readings from Last 3 Encounters:  10/31/23 134 lb 3.2 oz (60.9 kg)  06/17/22 129 lb (58.5 kg)  08/06/21 140 lb (63.5 kg)     GEN:  Well nourished, well developed in no acute distress HEENT: Normal NECK: No JVD; No carotid bruits LYMPHATICS: No lymphadenopathy CARDIAC: RRR, no murmurs, rubs, gallops RESPIRATORY:  Clear to auscultation without rales, wheezing or rhonchi  ABDOMEN: Soft, non-tender, non-distended MUSCULOSKELETAL:  No edema; No deformity  SKIN: Warm and dry NEUROLOGIC:  Alert and oriented x 3 PSYCHIATRIC:  Normal affect    Risk Assessment/Risk Calculators:     CARPREG II Risk Prediction Index Score:  1.  The patient's risk for a primary cardiac event is 5%.   Modified World Health Organization [facility]) Classification of Maternal CV Risk   Class I         ASSESSMENT & PLAN:    Palpitations in Pregnancy History of PACs and possible AFib. Currently [redacted]  weeks pregnant and experiencing palpitations approximately once or twice a week. No significant shortness of breath. No EKG data from Apple Watch during episodes. -Order 2-week heart monitor to assess for AFib or other arrhythmias. -Advise patient to report any worsening shortness of breath or syncope.  History of DVT On Lovenox for DVT prophylaxis during current pregnancy due to history of DVT in previous pregnancy. -Continue Lovenox as prescribed.  Follow-up Virtual visit in approximately 10  weeks. Earlier if monitor results are concerning or if patient experiences worsening symptoms.    Patient Instructions  Medication Instructions:  Your physician recommends that you continue on your current medications as directed. Please refer to the Current Medication list given to you today.  *If you need a refill on your cardiac medications before your next appointment, please call your pharmacy*   Testing/Procedures: ZIO XT- Long Term Monitor Instructions  Your physician has requested you wear a ZIO patch monitor for 14 days.  This is a single patch monitor. [employer] supplies one patch monitor per enrollment. Additional stickers are not available. Please do not apply patch if you will be having a Nuclear Stress Test,  Echocardiogram, Cardiac CT, MRI, or Chest Xray during the period you would be wearing the  monitor. The patch cannot be worn during these tests. You cannot remove and re-apply the  ZIO XT patch monitor.  Your ZIO patch monitor will be mailed 3 day USPS to your address on file. It may take 3-5 days  to receive your monitor after you have been enrolled.  Once you have received your monitor, please review the enclosed instructions. Your monitor  has already been registered assigning a specific monitor serial # to you.  Billing and Patient Assistance Program Information  We have supplied [employer] with any of your insurance information on file for billing purposes. [employer] offers  a sliding scale Patient Assistance Program for patients that do not have  insurance, or whose insurance does not completely cover the cost of the ZIO monitor.  You must apply for the Patient Assistance Program to qualify for this discounted rate.  To apply, please call [employer] at [phone], select option 4, select option 2, ask to apply for  Patient Assistance Program. Meredeth Ide will ask your household income, and how many people  are in your household. They will quote your out-of-pocket cost based on that information.  [employer] will also be able to set up a 19-month, interest-free payment plan if needed.  Applying the monitor   Shave hair from upper left chest.  Hold abrader disc by orange tab. Rub abrader in 40 strokes over the upper left chest as  indicated in your monitor instructions.  Clean area with 4 enclosed alcohol pads. Let dry.  Apply patch as indicated in monitor instructions. Patch will be placed under collarbone on left  side of chest with arrow pointing upward.  Rub patch adhesive wings for 2 minutes. Remove white label marked "1". Remove the white  label marked "2". Rub patch adhesive wings for 2 additional minutes.  While looking in a mirror, press and release button in center of patch. A small green light will  flash 3-4 times. This will be your only indicator that the monitor has been turned on.  Do not shower for the first 24 hours. You may shower after the first 24 hours.  Press the button if you feel a symptom. You will hear a small click. Record Date, Time and  Symptom in the Patient Logbook.  When you are ready to remove the patch, follow instructions on the last 2 pages of Patient  Logbook. Stick patch monitor onto the last page of Patient Logbook.  Place Patient Logbook in the blue and white box. Use locking tab on box and tape box closed  securely. The blue and white box has prepaid postage on it. Please place it in the mailbox as  soon as possible. Your  physician should have your test  results approximately 7 days after the  monitor has been mailed back to [facility].  Call [facility] Customer Care at [phone] if you have questions regarding  your ZIO XT patch monitor. Call them immediately if you see an orange light blinking on your  monitor.  If your monitor falls off in less than 4 days, contact our Monitor department at [phone].  If your monitor becomes loose or falls off after 4 days call [employer] at [phone] for  suggestions on securing your monitor    Follow-Up: At [facility], you and your health needs are our priority.  As part of our continuing mission to provide you with exceptional heart care, we have created designated Provider Care Teams.  These Care Teams include your primary Cardiologist (physician) and Advanced Practice Providers (APPs -  Physician Assistants and Nurse Practitioners) who all work together to provide you with the care you need, when you need it.   Your next appointment:   April 10th at 8:40 am via MyChart   Provider:   Thomasene Ripple, DO   Other Instructions:      Dispo:  No follow-ups on file.   Medication Adjustments/Labs and Tests Ordered: Current medicines are reviewed at length with the patient today.  Concerns regarding medicines are outlined above.  Tests Ordered: Orders Placed This Encounter  Procedures   LONG TERM MONITOR (3-14 DAYS)   Medication Changes: Meds ordered this encounter  Medications   Blood Pressure Monitoring (BLOOD PRESSURE MONITOR AUTOMAT) DEVI    Sig: Use as directed to check blood pressure.    Dispense:  1 each    Refill:  0

## 2023-11-12 ENCOUNTER — Other Ambulatory Visit (HOSPITAL_COMMUNITY): Payer: Self-pay

## 2023-11-21 DIAGNOSIS — R002 Palpitations: Secondary | ICD-10-CM | POA: Diagnosis not present

## 2023-11-27 DIAGNOSIS — R002 Palpitations: Secondary | ICD-10-CM

## 2023-12-03 ENCOUNTER — Encounter: Payer: Self-pay | Admitting: Cardiology

## 2023-12-24 DIAGNOSIS — Z23 Encounter for immunization: Secondary | ICD-10-CM | POA: Diagnosis not present

## 2023-12-24 DIAGNOSIS — Z3689 Encounter for other specified antenatal screening: Secondary | ICD-10-CM | POA: Diagnosis not present

## 2023-12-24 DIAGNOSIS — R5383 Other fatigue: Secondary | ICD-10-CM | POA: Diagnosis not present

## 2023-12-24 DIAGNOSIS — Z3A27 27 weeks gestation of pregnancy: Secondary | ICD-10-CM | POA: Diagnosis not present

## 2024-01-08 ENCOUNTER — Ambulatory Visit: Payer: 59 | Attending: Cardiology | Admitting: Cardiology

## 2024-01-08 ENCOUNTER — Encounter: Payer: Self-pay | Admitting: Cardiology

## 2024-01-08 VITALS — BP 101/59 | HR 86 | Ht 62.0 in | Wt 150.0 lb

## 2024-01-08 DIAGNOSIS — I471 Supraventricular tachycardia, unspecified: Secondary | ICD-10-CM

## 2024-01-08 DIAGNOSIS — Z3A29 29 weeks gestation of pregnancy: Secondary | ICD-10-CM

## 2024-01-08 NOTE — Patient Instructions (Signed)
 Medication Instructions:  Your physician recommends that you continue on your current medications as directed. Please refer to the Current Medication list given to you today.  *If you need a refill on your cardiac medications before your next appointment, please call your pharmacy*  Follow-Up: At [facility], you and your health needs are our priority.  As part of our continuing mission to provide you with exceptional heart care, our providers are all part of one team.  This team includes your primary Cardiologist (physician) and Advanced Practice Providers or APPs (Physician Assistants and Nurse Practitioners) who all work together to provide you with the care you need, when you need it.  Your next appointment:   3 month(s)  Provider:   Thomasene Ripple, DO    Other Instructions:   1st Floor: - Lobby - Registration  - Pharmacy  - Lab - Cafe  2nd Floor: - PV Lab - Diagnostic Testing (echo, CT, nuclear med)  3rd Floor: - Vacant  4th Floor: - TCTS (cardiothoracic surgery) - AFib Clinic - Structural Heart Clinic - Vascular Surgery  - Vascular Ultrasound  5th Floor: - HeartCare Cardiology (general and EP) - Clinical Pharmacy for coumadin, hypertension, lipid, weight-loss medications, and med management appointments    Valet parking services will be available as well.

## 2024-01-08 NOTE — Progress Notes (Signed)
 Cardio-Obstetrics Clinic  Follow Up Note   Date:  01/08/2024   ID:  [name], DOB 1981-09-12, MRN [id number]  PCP:  Zelphia Cairo, MD   [facility] Providers Cardiologist:  Donato Schultz, MD  Electrophysiologist:  None        Referring MD: Zelphia Cairo, MD   Chief Complaint: " I am still having palpitations"  History of Present Illness:    Kathy Tyler is a 43 y.o. female [[id number]] who returns for follow up of palpitations.  She did have ZIO monitor which showed paroxysmal SVT as well as rare symptomatic these.  Palpitations have not worsened.  She is doing well.   Prior CV Studies Reviewed: The following studies were reviewed today: Zio monitor  Past Medical History:  Diagnosis Date   Anemia    Anxiety    general and pp   Depression    pp anx and depression   DVT (deep vein thrombosis) in pregnancy    12/18   Dyslipidemia    Hypercholesterolemia    Mental disorder    Skipped heart beats     Past Surgical History:  Procedure Laterality Date   CESAREAN SECTION N/A 12/21/2015   Procedure: CESAREAN SECTION;  Surgeon: Zelphia Cairo, MD;  Location: [facility];  Service: Obstetrics;  Laterality: N/A;   CESAREAN SECTION N/A 01/06/2018   Procedure: REPEAT CESAREAN SECTION;  Surgeon: Zelphia Cairo, MD;  Location: [facility] BIRTHING SUITES;  Service: Obstetrics;  Laterality: N/A;  Repeat edc 01/12/18 allergy to tobramycin, ophthalmic solution Tracey RNFA   WISDOM TOOTH EXTRACTION  2007      OB History     Gravida  5   Para  2   Term  2   Preterm      AB  1   Living  2      SAB  1   IAB      Ectopic      Multiple  0   Live Births  2               Current Medications: Current Meds  Medication Sig   aspirin 81 MG chewable tablet Chew 81 mg by mouth daily.   clotrimazole (LOTRIMIN) 1 % external solution Apply 1 application topically 2 (two) times daily as needed (athletes foot).   enoxaparin  (LOVENOX) 40 MG/0.4ML injection Inject 40 mg into the skin daily.   fluticasone (FLONASE) 50 MCG/ACT nasal spray Place 1 spray into both nostrils at bedtime.   Prenatal MV-Min-Fe Fum-FA-DHA (PRENATAL+DHA PO) Take by mouth daily.   sertraline (ZOLOFT) 50 MG tablet Take 50 mg by mouth at bedtime.     Allergies:   Tobramycin   Social History   Socioeconomic History   Marital status: Married    Spouse name: Not on file   Number of children: Not on file   Years of education: Not on file   Highest education level: Not on file  Occupational History   Not on file  Tobacco Use   Smoking status: Never   Smokeless tobacco: Never  Vaping Use   Vaping status: Never Used  Substance and Sexual Activity   Alcohol use: No   Drug use: No   Sexual activity: Yes    Birth control/protection: None  Other Topics Concern   Not on file  Social History Narrative   Not on file   Social Drivers of Health   Financial Resource Strain: Not on file  Food Insecurity: No  Food Insecurity (11/02/2023)   Hunger Vital Sign    Worried About Running Out of Food in the Last Year: Never true    Ran Out of Food in the Last Year: Never true  Transportation Needs: No Transportation Needs (11/02/2023)   PRAPARE - Administrator, Civil Service (Medical): No    Lack of Transportation (Non-Medical): No  Physical Activity: Not on file  Stress: Not on file  Social Connections: Not on file      Family History  Problem Relation Age of Onset   Heart disease Maternal Grandmother    Diabetes Maternal Grandmother    Bladder Cancer Maternal Grandmother    Stroke Maternal Grandfather    Colon cancer Maternal Grandfather    Heart disease Paternal Grandfather    Heart attack Paternal Grandfather    Hypertension Paternal Grandfather    Atrial fibrillation Mother       ROS:   Please see the history of present illness.    Potation's All other systems reviewed and are negative.   Labs/EKG Reviewed:     EKG:   None today   Recent Labs: No results found for requested labs within last 365 days.   Recent Lipid Panel No results found for: "CHOL", "TRIG", "HDL", "CHOLHDL", "LDLCALC", "LDLDIRECT"  Physical Exam:    VS:  BP (!) 101/59 (BP Location: Left Arm, Patient Position: Sitting, Cuff Size: Normal)   Pulse 86   Ht 5\' 2"  (1.575 m)   Wt 150 lb (68 kg)   BMI 27.44 kg/m     Wt Readings from Last 3 Encounters:  01/08/24 150 lb (68 kg)  10/31/23 134 lb 3.2 oz (60.9 kg)  06/17/22 129 lb (58.5 kg)      Risk Assessment/Risk Calculators:     CARPREG II Risk Prediction Index Score:  1.  The patient's risk for a primary cardiac event is 5%.   Modified World Health Organization [facility]) Classification of Maternal CV Risk   Class I         ASSESSMENT & PLAN:    PSVT - very rare.  At this point shared decision will not add any medication. [redacted] weeks gestation  Patient Instructions  Medication Instructions:  Your physician recommends that you continue on your current medications as directed. Please refer to the Current Medication list given to you today.  *If you need a refill on your cardiac medications before your next appointment, please call your pharmacy*  Follow-Up: At [facility], you and your health needs are our priority.  As part of our continuing mission to provide you with exceptional heart care, our providers are all part of one team.  This team includes your primary Cardiologist (physician) and Advanced Practice Providers or APPs (Physician Assistants and Nurse Practitioners) who all work together to provide you with the care you need, when you need it.  Your next appointment:   3 month(s)  Provider:   Thomasene Ripple, DO    Other Instructions:   1st Floor: - Lobby - Registration  - Pharmacy  - Lab - Cafe  2nd Floor: - PV Lab - Diagnostic Testing (echo, CT, nuclear med)  3rd Floor: - Vacant  4th Floor: - TCTS (cardiothoracic surgery) - AFib  Clinic - Structural Heart Clinic - Vascular Surgery  - Vascular Ultrasound  5th Floor: - HeartCare Cardiology (general and EP) - Clinical Pharmacy for coumadin, hypertension, lipid, weight-loss medications, and med management appointments    Valet parking services will be available  as well.      Dispo:  Return in about 3 months (around 04/08/2024).   Medication Adjustments/Labs and Tests Ordered: Current medicines are reviewed at length with the patient today.  Concerns regarding medicines are outlined above.  Tests Ordered: No orders of the defined types were placed in this encounter.  Medication Changes: No orders of the defined types were placed in this encounter.

## 2024-02-02 DIAGNOSIS — O3663X0 Maternal care for excessive fetal growth, third trimester, not applicable or unspecified: Secondary | ICD-10-CM | POA: Diagnosis not present

## 2024-02-02 DIAGNOSIS — Z3A33 33 weeks gestation of pregnancy: Secondary | ICD-10-CM | POA: Diagnosis not present

## 2024-02-02 DIAGNOSIS — O09519 Supervision of elderly primigravida, unspecified trimester: Secondary | ICD-10-CM | POA: Diagnosis not present

## 2024-02-02 DIAGNOSIS — O26813 Pregnancy related exhaustion and fatigue, third trimester: Secondary | ICD-10-CM | POA: Diagnosis not present

## 2024-02-09 ENCOUNTER — Non-Acute Institutional Stay (HOSPITAL_COMMUNITY)
Admission: RE | Admit: 2024-02-09 | Discharge: 2024-02-09 | Disposition: A | Discharge status: Home / Self Care | Source: Ambulatory Visit | Attending: Internal Medicine | Admitting: Internal Medicine

## 2024-02-09 DIAGNOSIS — O99019 Anemia complicating pregnancy, unspecified trimester: Secondary | ICD-10-CM | POA: Insufficient documentation

## 2024-02-09 DIAGNOSIS — Z3A00 Weeks of gestation of pregnancy not specified: Secondary | ICD-10-CM | POA: Insufficient documentation

## 2024-02-09 MED ORDER — SODIUM CHLORIDE 0.9 % IV SOLN
510.0000 mg | Freq: Once | INTRAVENOUS | Status: AC
  Administered 2024-02-09: 510 mg via INTRAVENOUS
  Filled 2024-02-09: qty 17

## 2024-02-09 MED ORDER — SODIUM CHLORIDE 0.9 % IV SOLN: INTRAVENOUS | Status: DC | PRN

## 2024-02-09 NOTE — Progress Notes (Signed)
 PATIENT CARE CENTER NOTE  Diagnosis: 099.013: Anemia of Pregnancy   Provider: Ethridge Herder, MD  Procedure: Feraheme 510 mg   Note: Patient received IV Feraheme 510 mg via PIV (dose #1 of 2). No pre-meds per orders. During infusion pt had stuffy nose, watery eyes, and cough. Per pt she has allergies, but these symptoms started with the iron. Pt completed iron infusion, and vital signs remain stable. RN told pt that we could reach out to her doctor to see if she could get a dose of Benadryl  to help with symptoms. Pt declined, and said she would be taking her Flo-nase soon. RN called [facility] to let the office know about the pts symptoms. This RN left a voicemail for Edwina Gram, and provided this office's call back number. Pt observed for 40 minutes post infusion. Pt feels like her symptoms are resolving, vital signs remain stable at discharge. Pt provided with AVS and advised that if she develops any new or worsening symptoms, that she should seek medical attention and let provider know, pt verbalized understanding. Pt advised that she can schedule her next appointment at the front desk for 1 week. Pt is alert, oriented, and ambulatory at discharge.

## 2024-02-09 NOTE — Progress Notes (Signed)
 Marybeth from [facility] called Pt care center following up on pts infusion from today. Marybeth said per provider, Dr. Alain Howard, pt can receive IV Benadryl  prior to next Feraheme infusion, and since patients vitals were stable no other action needed at this time. Per Manning Seen she will fax new order for pre-medication.

## 2024-02-17 ENCOUNTER — Non-Acute Institutional Stay (HOSPITAL_COMMUNITY)
Admission: RE | Admit: 2024-02-17 | Discharge: 2024-02-17 | Disposition: A | Discharge status: Home / Self Care | Source: Ambulatory Visit | Attending: Internal Medicine | Admitting: Internal Medicine

## 2024-02-17 DIAGNOSIS — O99013 Anemia complicating pregnancy, third trimester: Secondary | ICD-10-CM | POA: Insufficient documentation

## 2024-02-17 MED ORDER — DIPHENHYDRAMINE HCL 50 MG/ML IJ SOLN
50.0000 mg | Freq: Once | INTRAMUSCULAR | Status: AC
  Administered 2024-02-17: 25 mg via INTRAVENOUS
  Filled 2024-02-17: qty 1

## 2024-02-17 MED ORDER — SODIUM CHLORIDE 0.9 % IV SOLN
510.0000 mg | Freq: Once | INTRAVENOUS | Status: AC
  Administered 2024-02-17: 510 mg via INTRAVENOUS
  Filled 2024-02-17: qty 17

## 2024-02-17 MED ORDER — SODIUM CHLORIDE 0.9 % IV SOLN: INTRAVENOUS | Status: DC | PRN

## 2024-02-17 NOTE — Progress Notes (Signed)
 PATIENT CARE CENTER NOTE   Diagnosis: O99.013 Anemia of Pregnancy    Provider: Ethridge Herder, MD   Procedure: Feraheme infusion    Note: Patient received Feraheme 510 mg infusion (dose # 2 of 2) via PIV. Patient had a mild reaction during previous infusion so Dr. Alain Howard ordered patient to be pre-medicated with 50 mg IV Benadryl . Patient requested to only received 25 mg IV Benadryl . Pre-medicated patient with 25 mg IV Benadryl . Feraheme infusion given and patient tolerated well with no adverse reaction. Observed patient for 30 minutes post infusion. Vital signs remained stable. Printed AVS offered but patient refused. Patient alert, oriented and ambulatory at discharge.

## 2024-02-26 DIAGNOSIS — Z3685 Encounter for antenatal screening for Streptococcus B: Secondary | ICD-10-CM | POA: Diagnosis not present

## 2024-03-01 DIAGNOSIS — O09293 Supervision of pregnancy with other poor reproductive or obstetric history, third trimester: Secondary | ICD-10-CM | POA: Diagnosis not present

## 2024-03-01 DIAGNOSIS — O3429 Maternal care due to uterine scar from other previous surgery: Secondary | ICD-10-CM | POA: Diagnosis not present

## 2024-03-01 DIAGNOSIS — O09523 Supervision of elderly multigravida, third trimester: Secondary | ICD-10-CM | POA: Diagnosis not present

## 2024-03-03 ENCOUNTER — Encounter (HOSPITAL_COMMUNITY): Payer: Self-pay | Admitting: *Deleted

## 2024-03-05 ENCOUNTER — Encounter (HOSPITAL_COMMUNITY): Payer: Self-pay

## 2024-03-05 NOTE — Patient Instructions (Signed)
 Kathy Tyler  03/05/2024   Your procedure is scheduled on:  03/16/2024  Arrive at 0530 at [facility] on [address] at [facility]  and [address]. You are invited to use the FREE valet parking or use the Visitor's parking deck.  Pick up the phone at the desk and dial [phone].  Call this number if you have problems the morning of surgery: [phone]  Remember:   Do not eat food:(After Midnight) Desps de medianoche.  You may drink clear liquids until  ___0330__.  Clear liquids means a liquid you can see thru.  It can have color such as Cola or Kool aid.  Tea is OK and coffee as long as no milk or creamer of any kind.  Take these medicines the morning of surgery with A SIP OF WATER:  No lovenox  the night before or day of surgery   Do not wear jewelry, make-up or nail polish.  Do not wear lotions, powders, or perfumes. Do not wear deodorant.  Do not shave 48 hours prior to surgery.  Do not bring valuables to the hospital.  [facility] is not   responsible for any belongings or valuables brought to the hospital.  Contacts, dentures or bridgework may not be worn into surgery.  Leave suitcase in the car. After surgery it may be brought to your room.  For patients admitted to the hospital, checkout time is 11:00 AM the day of              discharge.      Please read over the following fact sheets that you were given:     Preparing for Surgery

## 2024-03-09 DIAGNOSIS — Z3A38 38 weeks gestation of pregnancy: Secondary | ICD-10-CM | POA: Diagnosis not present

## 2024-03-09 DIAGNOSIS — O99013 Anemia complicating pregnancy, third trimester: Secondary | ICD-10-CM | POA: Diagnosis not present

## 2024-03-09 DIAGNOSIS — O09523 Supervision of elderly multigravida, third trimester: Secondary | ICD-10-CM | POA: Diagnosis not present

## 2024-03-09 DIAGNOSIS — O09293 Supervision of pregnancy with other poor reproductive or obstetric history, third trimester: Secondary | ICD-10-CM | POA: Diagnosis not present

## 2024-03-15 ENCOUNTER — Encounter (HOSPITAL_COMMUNITY): Payer: Self-pay | Admitting: Obstetrics and Gynecology

## 2024-03-15 ENCOUNTER — Encounter (HOSPITAL_COMMUNITY)
Admission: RE | Admit: 2024-03-15 | Discharge: 2024-03-15 | Disposition: A | Discharge status: Home / Self Care | Source: Ambulatory Visit | Attending: Obstetrics and Gynecology | Admitting: Obstetrics and Gynecology

## 2024-03-15 DIAGNOSIS — Z01812 Encounter for preprocedural laboratory examination: Secondary | ICD-10-CM | POA: Insufficient documentation

## 2024-03-15 DIAGNOSIS — Z98891 History of uterine scar from previous surgery: Secondary | ICD-10-CM | POA: Insufficient documentation

## 2024-03-15 LAB — CBC
HCT: 39.9 % (ref 36.0–46.0)
Hemoglobin: 13.3 g/dL (ref 12.0–15.0)
MCH: 32.4 pg (ref 26.0–34.0)
MCHC: 33.3 g/dL (ref 30.0–36.0)
MCV: 97.3 fL (ref 80.0–100.0)
Platelets: 273 10*3/uL (ref 150–400)
RBC: 4.1 MIL/uL (ref 3.87–5.11)
RDW: 16 % — ABNORMAL HIGH (ref 11.5–15.5)
WBC: 9.1 10*3/uL (ref 4.0–10.5)
nRBC: 0 % (ref 0.0–0.2)

## 2024-03-15 LAB — TYPE AND SCREEN
ABO/RH(D): A POS
Antibody Screen: NEGATIVE

## 2024-03-15 LAB — RPR: RPR Ser Ql: NONREACTIVE

## 2024-03-15 NOTE — H&P (Signed)
 Kathy Tyler is a 43 y.o. female presenting for scheduled procedure. +FM, denies VB, LOF, ctx.  PNC c/b 1) AMA - low risk NIPT, baby ASA, weekly Nst after 36wks 2) DVT - h/o DVT in pregnancy, on Lovenox  3) Palpitations with concomitant anemia - IV venofer x2, s/p Cards eval and Zio monitor, rare tachycardia, ok to monitor 4) Depresison with h/o fetal loss - 16wk loss, spontaneous (cord accident plus amniotic band), on sertaline 50mg  at bedtime 5) H?o csx x2 - repeat with salpingectomy desired  GBS neg OB History     Gravida  5   Para  2   Term  2   Preterm      AB  2   Living  2      SAB  2   IAB      Ectopic      Multiple  0   Live Births  2          Past Medical History:  Diagnosis Date   Anemia    Anxiety    general and pp   Depression    pp anx and depression   DVT (deep vein thrombosis) in pregnancy    12/18   Dyslipidemia    Hypercholesterolemia    Mental disorder    Skipped heart beats    Past Surgical History:  Procedure Laterality Date   CESAREAN SECTION N/A 12/21/2015   Procedure: CESAREAN SECTION;  Surgeon: Ashby Lawman, MD;  Location: [facility];  Service: Obstetrics;  Laterality: N/A;   CESAREAN SECTION N/A 01/06/2018   Procedure: REPEAT CESAREAN SECTION;  Surgeon: Ashby Lawman, MD;  Location: [facility];  Service: Obstetrics;  Laterality: N/A;  Repeat edc 01/12/18 allergy to tobramycin, ophthalmic solution Kathy Tyler RNFA   WISDOM TOOTH EXTRACTION  2007   Family History: family history includes Atrial fibrillation in her mother; Bladder Cancer in her maternal grandmother; Breast cancer in her paternal grandmother; Colon cancer in her maternal grandfather; Diabetes in her maternal grandmother; Heart attack in her paternal grandfather; Heart disease in her maternal grandmother and paternal grandfather; Hypertension in her paternal grandfather; Stroke in her maternal grandfather. Social History:  reports that she has  never smoked. She has never used smokeless tobacco. She reports that she does not drink alcohol and does not use drugs.     Maternal Diabetes: No1hr 127 Genetic Screening: Normal Maternal Ultrasounds/Referrals: Normal Fetal Ultrasounds or other Referrals:  None Maternal Substance Abuse:  No Significant Maternal Medications:  None Significant Maternal Lab Results:  Group B Strep negative Number of Prenatal Visits:greater than 3 verified prenatal visits Maternal Vaccinations:TDap and Flu Other Comments:  None  Review of Systems  Constitutional:  Negative for chills and fever.  Respiratory:  Negative for shortness of breath.   Cardiovascular:  Negative for chest pain, palpitations and leg swelling.  Gastrointestinal:  Positive for nausea. Negative for abdominal pain and vomiting.  Neurological:  Negative for dizziness, weakness and headaches.  Psychiatric/Behavioral:  Negative for suicidal ideas.    Maternal Medical History:  Reason for admission: Nausea.   Fetal activity: Perceived fetal activity is normal.   Prenatal complications: No bleeding or PIH.   Prenatal Complications - Diabetes: none.     unknown if currently breastfeeding. Exam Physical Exam Constitutional:      General: She is not in acute distress.    Appearance: She is well-developed.  HENT:     Head: Normocephalic and atraumatic.   Eyes:     Pupils:  Pupils are equal, round, and reactive to light.    Cardiovascular:     Rate and Rhythm: Normal rate and regular rhythm.     Heart sounds: No murmur heard.    No gallop.  Abdominal:     Tenderness: There is no abdominal tenderness. There is no guarding or rebound.  Genitourinary:    Vagina: Normal.   Musculoskeletal:        General: Normal range of motion.     Cervical back: Normal range of motion and neck supple.   Skin:    General: Skin is warm and dry.   Neurological:     Mental Status: She is alert and oriented to person, place, and time.      Prenatal labs: ABO, Rh: --/--/A POS (06/16 5409) Antibody: NEG (06/16 8119) Rubella: Immune (11/20 0000) RPR: NON REACTIVE (06/16 0920)  HBsAg: Negative (11/20 0000)  HIV: Non-reactive (11/20 0000)  GBS:   neg  Assessment/Plan: This is a 43yo [id number] @ 39 1/7 by LMP c/w 9wk TVUS presenting for scheduled ERLTCS+BS. R/B/A of cesarean section discussed with patient. Alternative would be vaginal delivery which would mean shorter postpartum stay and decreased risk of bleeding. Risks of section include infection of the uterus, pelvic organs, or skin, inadvertent injury to internal organs, such as bowel or bladder. If there is major injury, extensive surgery may be required. If injury is minor, it may be treated with relative ease. Discussed possibility of excessive blood loss and transfusion. If bleeding cannot be controlled using medical or minor surgical methods, a cesarean hysterectomy may be performed which would mean no future fertility. Patient accepts the possibility of blood transfusion, if necessary. Patient understands and agrees to move forward with section.    Martin Slay Shirl Ludington 03/15/2024, 10:24 PM

## 2024-03-15 NOTE — Anesthesia Preprocedure Evaluation (Signed)
 Anesthesia Evaluation  Patient identified by MRN, date of birth, ID band Patient awake    Reviewed: Allergy & Precautions, NPO status , Patient's Chart, lab work & pertinent test results  Airway Mallampati: II  TM Distance: >3 FB Neck ROM: Full    Dental  (+) Teeth Intact, Dental Advisory Given   Pulmonary neg pulmonary ROS   Pulmonary exam normal breath sounds clear to auscultation       Cardiovascular + DVT (in pregnancy, 2018- on lovenox  40mg  LD Sunday 9PM)  Normal cardiovascular exam+ dysrhythmias (palpitations this pregnancy, saw cards- occasional PSVT on holter, no meds) Supra Ventricular Tachycardia  Rhythm:Regular Rate:Normal     Neuro/Psych  PSYCHIATRIC DISORDERS Anxiety Depression    negative neurological ROS     GI/Hepatic negative GI ROS, Neg liver ROS,,,  Endo/Other  negative endocrine ROS    Renal/GU negative Renal ROS  negative genitourinary   Musculoskeletal negative musculoskeletal ROS (+)    Abdominal   Peds  Hematology Hb 13.3, plt 273   Anesthesia Other Findings   Reproductive/Obstetrics (+) Pregnancy 2 prior sections 2017 and 2019. 2022 16 week fetal demise delivered vaginally                             Anesthesia Physical Anesthesia Plan  ASA: 2  Anesthesia Plan: Spinal   Post-op Pain Management: Toradol  IV (intra-op)* and Ofirmev  IV (intra-op)*   Induction:   PONV Risk Score and Plan: 3 and Ondansetron , Dexamethasone , Treatment may vary due to age or medical condition and Scopolamine  patch - Pre-op  Airway Management Planned: Natural Airway  Additional Equipment: None  Intra-op Plan:   Post-operative Plan:   Informed Consent: I have reviewed the patients History and Physical, chart, labs and discussed the procedure including the risks, benefits and alternatives for the proposed anesthesia with the patient or authorized representative who has indicated  his/her understanding and acceptance.       Plan Discussed with: CRNA  Anesthesia Plan Comments:        Anesthesia Quick Evaluation

## 2024-03-16 ENCOUNTER — Inpatient Hospital Stay (HOSPITAL_COMMUNITY): Payer: Self-pay | Admitting: Anesthesiology

## 2024-03-16 ENCOUNTER — Encounter (HOSPITAL_COMMUNITY)
Admission: RE | Disposition: A | Discharge status: Home / Self Care | Payer: Self-pay | Source: Home / Self Care | Attending: Obstetrics and Gynecology

## 2024-03-16 ENCOUNTER — Other Ambulatory Visit: Payer: Self-pay

## 2024-03-16 ENCOUNTER — Encounter (HOSPITAL_COMMUNITY): Payer: Self-pay | Admitting: Obstetrics and Gynecology

## 2024-03-16 ENCOUNTER — Inpatient Hospital Stay (HOSPITAL_COMMUNITY)
Admission: RE | Admit: 2024-03-16 | Discharge: 2024-03-19 | DRG: 785 | Disposition: A | Discharge status: Home / Self Care | Attending: Obstetrics and Gynecology | Admitting: Obstetrics and Gynecology

## 2024-03-16 DIAGNOSIS — Z3A39 39 weeks gestation of pregnancy: Secondary | ICD-10-CM | POA: Diagnosis not present

## 2024-03-16 DIAGNOSIS — Z833 Family history of diabetes mellitus: Secondary | ICD-10-CM | POA: Diagnosis not present

## 2024-03-16 DIAGNOSIS — Z86718 Personal history of other venous thrombosis and embolism: Secondary | ICD-10-CM

## 2024-03-16 DIAGNOSIS — Z302 Encounter for sterilization: Secondary | ICD-10-CM

## 2024-03-16 DIAGNOSIS — O34211 Maternal care for low transverse scar from previous cesarean delivery: Principal | ICD-10-CM | POA: Diagnosis present

## 2024-03-16 DIAGNOSIS — O99344 Other mental disorders complicating childbirth: Secondary | ICD-10-CM | POA: Diagnosis present

## 2024-03-16 DIAGNOSIS — Z3A38 38 weeks gestation of pregnancy: Secondary | ICD-10-CM | POA: Diagnosis not present

## 2024-03-16 DIAGNOSIS — F32A Depression, unspecified: Secondary | ICD-10-CM | POA: Diagnosis present

## 2024-03-16 DIAGNOSIS — Z8249 Family history of ischemic heart disease and other diseases of the circulatory system: Secondary | ICD-10-CM

## 2024-03-16 DIAGNOSIS — Z7901 Long term (current) use of anticoagulants: Secondary | ICD-10-CM

## 2024-03-16 DIAGNOSIS — O43893 Other placental disorders, third trimester: Secondary | ICD-10-CM | POA: Diagnosis not present

## 2024-03-16 DIAGNOSIS — R002 Palpitations: Secondary | ICD-10-CM | POA: Diagnosis present

## 2024-03-16 DIAGNOSIS — N838 Other noninflammatory disorders of ovary, fallopian tube and broad ligament: Secondary | ICD-10-CM | POA: Diagnosis not present

## 2024-03-16 DIAGNOSIS — Z98891 History of uterine scar from previous surgery: Secondary | ICD-10-CM | POA: Diagnosis not present

## 2024-03-16 LAB — CBC
HCT: 38.7 % (ref 36.0–46.0)
Hemoglobin: 13.1 g/dL (ref 12.0–15.0)
MCH: 32.7 pg (ref 26.0–34.0)
MCHC: 33.9 g/dL (ref 30.0–36.0)
MCV: 96.5 fL (ref 80.0–100.0)
Platelets: 234 10*3/uL (ref 150–400)
RBC: 4.01 MIL/uL (ref 3.87–5.11)
RDW: 15.9 % — ABNORMAL HIGH (ref 11.5–15.5)
WBC: 16.1 10*3/uL — ABNORMAL HIGH (ref 4.0–10.5)
nRBC: 0 % (ref 0.0–0.2)

## 2024-03-16 LAB — CREATININE, SERUM
Creatinine, Ser: 0.68 mg/dL (ref 0.44–1.00)
GFR, Estimated: >60 mL/min (ref >60)

## 2024-03-16 SURGERY — Surgical Case
Anesthesia: Spinal | Laterality: Bilateral

## 2024-03-16 MED ORDER — COCONUT OIL OIL
1.0000 | TOPICAL_OIL | Status: DC | PRN
  Administered 2024-03-18: 1 via TOPICAL

## 2024-03-16 MED ORDER — KETOROLAC TROMETHAMINE 30 MG/ML IJ SOLN
30.0000 mg | Freq: Four times a day (QID) | INTRAMUSCULAR | Status: AC
  Administered 2024-03-16 – 2024-03-17 (×3): 30 mg via INTRAVENOUS
  Filled 2024-03-16 (×4): qty 1

## 2024-03-16 MED ORDER — PRENATAL MULTIVITAMIN CH
1.0000 | ORAL_TABLET | Freq: Every day | ORAL | Status: DC
  Administered 2024-03-16 – 2024-03-19 (×4): 1 via ORAL
  Filled 2024-03-16 (×4): qty 1

## 2024-03-16 MED ORDER — SODIUM CHLORIDE 0.9 % IR SOLN
Status: DC | PRN
  Administered 2024-03-16: 1

## 2024-03-16 MED ORDER — ACETAMINOPHEN 500 MG PO TABS: 1000.0000 mg | ORAL_TABLET | Freq: Four times a day (QID) | ORAL | Status: DC

## 2024-03-16 MED ORDER — SCOPOLAMINE 1 MG/3DAYS TD PT72
1.0000 | MEDICATED_PATCH | Freq: Once | TRANSDERMAL | Status: AC
  Administered 2024-03-16: 1.5 mg via TRANSDERMAL

## 2024-03-16 MED ORDER — ACETAMINOPHEN 500 MG PO TABS
1000.0000 mg | ORAL_TABLET | Freq: Four times a day (QID) | ORAL | Status: DC
  Administered 2024-03-16 – 2024-03-19 (×11): 1000 mg via ORAL
  Filled 2024-03-16 (×12): qty 2

## 2024-03-16 MED ORDER — SODIUM CHLORIDE 0.9% FLUSH: 3.0000 mL | INTRAVENOUS | Status: DC | PRN

## 2024-03-16 MED ORDER — OXYTOCIN-SODIUM CHLORIDE 30-0.9 UT/500ML-% IV SOLN
INTRAVENOUS | Status: DC | PRN
  Administered 2024-03-16: 300 mL via INTRAVENOUS

## 2024-03-16 MED ORDER — OXYCODONE HCL 5 MG PO TABS: 5.0000 mg | ORAL_TABLET | ORAL | Status: DC | PRN

## 2024-03-16 MED ORDER — LACTATED RINGERS IV SOLN
INTRAVENOUS | Status: DC
  Administered 2024-03-16: 125 mL/h via INTRAVENOUS

## 2024-03-16 MED ORDER — MORPHINE SULFATE (PF) 0.5 MG/ML IJ SOLN
INTRAMUSCULAR | Status: DC | PRN
  Administered 2024-03-16: 150 ug via INTRATHECAL

## 2024-03-16 MED ORDER — DEXAMETHASONE SODIUM PHOSPHATE 10 MG/ML IJ SOLN
INTRAMUSCULAR | Status: DC | PRN
  Administered 2024-03-16: 10 mg via INTRAVENOUS

## 2024-03-16 MED ORDER — PHENYLEPHRINE HCL-NACL 20-0.9 MG/250ML-% IV SOLN
INTRAVENOUS | Status: DC | PRN
  Administered 2024-03-16: 60 ug/min via INTRAVENOUS

## 2024-03-16 MED ORDER — SERTRALINE HCL 50 MG PO TABS
50.0000 mg | ORAL_TABLET | Freq: Every day | ORAL | Status: DC
  Administered 2024-03-16 – 2024-03-18 (×3): 50 mg via ORAL
  Filled 2024-03-16 (×3): qty 1

## 2024-03-16 MED ORDER — SIMETHICONE 80 MG PO CHEW
80.0000 mg | CHEWABLE_TABLET | Freq: Three times a day (TID) | ORAL | Status: DC
  Administered 2024-03-16 – 2024-03-19 (×9): 80 mg via ORAL
  Filled 2024-03-16 (×10): qty 1

## 2024-03-16 MED ORDER — CEFAZOLIN SODIUM-DEXTROSE 2-4 GM/100ML-% IV SOLN
INTRAVENOUS | Status: AC
  Filled 2024-03-16: qty 100

## 2024-03-16 MED ORDER — FENTANYL CITRATE (PF) 100 MCG/2ML IJ SOLN
INTRAMUSCULAR | Status: DC | PRN
  Administered 2024-03-16: 15 ug via INTRATHECAL

## 2024-03-16 MED ORDER — ZOLPIDEM TARTRATE 5 MG PO TABS: 5.0000 mg | ORAL_TABLET | Freq: Every evening | ORAL | Status: DC | PRN

## 2024-03-16 MED ORDER — FENTANYL CITRATE (PF) 100 MCG/2ML IJ SOLN
INTRAMUSCULAR | Status: AC
Start: 2024-03-16 — End: 2024-03-16
  Filled 2024-03-16: qty 2

## 2024-03-16 MED ORDER — SENNOSIDES-DOCUSATE SODIUM 8.6-50 MG PO TABS
2.0000 | ORAL_TABLET | Freq: Every day | ORAL | Status: DC
  Administered 2024-03-17 – 2024-03-19 (×3): 2 via ORAL
  Filled 2024-03-16 (×3): qty 2

## 2024-03-16 MED ORDER — TRANEXAMIC ACID 1000 MG/10ML IV SOLN
INTRAVENOUS | Status: DC | PRN
  Administered 2024-03-16: 1000 mg via INTRAVENOUS

## 2024-03-16 MED ORDER — OXYCODONE HCL 5 MG/5ML PO SOLN: 5.0000 mg | Freq: Once | ORAL | Status: DC | PRN

## 2024-03-16 MED ORDER — ONDANSETRON HCL 4 MG/2ML IJ SOLN: 4.0000 mg | Freq: Once | INTRAMUSCULAR | Status: DC | PRN

## 2024-03-16 MED ORDER — KETOROLAC TROMETHAMINE 30 MG/ML IJ SOLN: 30.0000 mg | Freq: Four times a day (QID) | INTRAMUSCULAR | Status: AC | PRN

## 2024-03-16 MED ORDER — MORPHINE SULFATE (PF) 0.5 MG/ML IJ SOLN
INTRAMUSCULAR | Status: AC
  Filled 2024-03-16: qty 10

## 2024-03-16 MED ORDER — MEPERIDINE HCL 25 MG/ML IJ SOLN: 6.2500 mg | INTRAMUSCULAR | Status: DC | PRN

## 2024-03-16 MED ORDER — DIPHENHYDRAMINE HCL 25 MG PO CAPS: 25.0000 mg | ORAL_CAPSULE | ORAL | Status: DC | PRN

## 2024-03-16 MED ORDER — ENOXAPARIN SODIUM 40 MG/0.4ML IJ SOSY
40.0000 mg | PREFILLED_SYRINGE | INTRAMUSCULAR | Status: DC
  Administered 2024-03-17 – 2024-03-19 (×3): 40 mg via SUBCUTANEOUS
  Filled 2024-03-16 (×3): qty 0.4

## 2024-03-16 MED ORDER — AMISULPRIDE (ANTIEMETIC) 5 MG/2ML IV SOLN: 10.0000 mg | Freq: Once | INTRAVENOUS | Status: DC | PRN

## 2024-03-16 MED ORDER — HYDROMORPHONE HCL 1 MG/ML IJ SOLN: 0.2500 mg | INTRAMUSCULAR | Status: DC | PRN

## 2024-03-16 MED ORDER — SIMETHICONE 80 MG PO CHEW: 80.0000 mg | CHEWABLE_TABLET | ORAL | Status: DC | PRN

## 2024-03-16 MED ORDER — METHYLERGONOVINE MALEATE 0.2 MG/ML IJ SOLN
INTRAMUSCULAR | Status: DC | PRN
  Administered 2024-03-16: .2 mg via INTRAMUSCULAR

## 2024-03-16 MED ORDER — DIPHENHYDRAMINE HCL 25 MG PO CAPS
25.0000 mg | ORAL_CAPSULE | Freq: Four times a day (QID) | ORAL | Status: DC | PRN
Start: 2024-03-16 — End: 2024-03-19

## 2024-03-16 MED ORDER — OXYTOCIN-SODIUM CHLORIDE 30-0.9 UT/500ML-% IV SOLN: 2.5000 [IU]/h | INTRAVENOUS | Status: AC

## 2024-03-16 MED ORDER — ACETAMINOPHEN 10 MG/ML IV SOLN
INTRAVENOUS | Status: DC | PRN
Start: 2024-03-16 — End: 2024-03-16
  Administered 2024-03-16: 1000 mg via INTRAVENOUS

## 2024-03-16 MED ORDER — MENTHOL 3 MG MT LOZG: 1.0000 | LOZENGE | OROMUCOSAL | Status: DC | PRN

## 2024-03-16 MED ORDER — ONDANSETRON HCL 4 MG/2ML IJ SOLN
INTRAMUSCULAR | Status: DC | PRN
  Administered 2024-03-16: 4 mg via INTRAVENOUS

## 2024-03-16 MED ORDER — OXYCODONE HCL 5 MG PO TABS: 5.0000 mg | ORAL_TABLET | Freq: Once | ORAL | Status: DC | PRN

## 2024-03-16 MED ORDER — NALOXONE HCL 0.4 MG/ML IJ SOLN: 0.4000 mg | INTRAMUSCULAR | Status: DC | PRN

## 2024-03-16 MED ORDER — SCOPOLAMINE 1 MG/3DAYS TD PT72
MEDICATED_PATCH | TRANSDERMAL | Status: AC
  Filled 2024-03-16: qty 1

## 2024-03-16 MED ORDER — KETOROLAC TROMETHAMINE 30 MG/ML IJ SOLN: 30.0000 mg | Freq: Once | INTRAMUSCULAR | Status: DC | PRN

## 2024-03-16 MED ORDER — DIPHENHYDRAMINE HCL 50 MG/ML IJ SOLN: 12.5000 mg | INTRAMUSCULAR | Status: DC | PRN

## 2024-03-16 MED ORDER — IBUPROFEN 600 MG PO TABS
600.0000 mg | ORAL_TABLET | Freq: Four times a day (QID) | ORAL | Status: DC
  Administered 2024-03-17 – 2024-03-19 (×9): 600 mg via ORAL
  Filled 2024-03-16 (×9): qty 1

## 2024-03-16 MED ORDER — KETOROLAC TROMETHAMINE 30 MG/ML IJ SOLN
30.0000 mg | Freq: Four times a day (QID) | INTRAMUSCULAR | Status: AC | PRN
  Administered 2024-03-17: 30 mg via INTRAVENOUS

## 2024-03-16 MED ORDER — CEFAZOLIN SODIUM-DEXTROSE 2-4 GM/100ML-% IV SOLN
2.0000 g | INTRAVENOUS | Status: AC
  Administered 2024-03-16: 2 g via INTRAVENOUS

## 2024-03-16 MED ORDER — KETOROLAC TROMETHAMINE 15 MG/ML IJ SOLN
INTRAMUSCULAR | Status: DC | PRN
Start: 2024-03-16 — End: 2024-03-16
  Administered 2024-03-16: 30 mg via INTRAVENOUS

## 2024-03-16 MED ORDER — STERILE WATER FOR IRRIGATION IR SOLN
Status: DC | PRN
  Administered 2024-03-16: 1000 mL

## 2024-03-16 MED ORDER — POVIDONE-IODINE 10 % EX SWAB
2.0000 | Freq: Once | CUTANEOUS | Status: AC
  Administered 2024-03-16: 2 via TOPICAL

## 2024-03-16 MED ORDER — DIBUCAINE (PERIANAL) 1 % EX OINT: 1.0000 | TOPICAL_OINTMENT | CUTANEOUS | Status: DC | PRN

## 2024-03-16 MED ORDER — NALOXONE HCL 4 MG/10ML IJ SOLN: 1.0000 ug/kg/h | INTRAVENOUS | Status: DC | PRN

## 2024-03-16 MED ORDER — WITCH HAZEL-GLYCERIN EX PADS: 1.0000 | MEDICATED_PAD | CUTANEOUS | Status: DC | PRN

## 2024-03-16 MED ORDER — BUPIVACAINE IN DEXTROSE 0.75-8.25 % IT SOLN
INTRATHECAL | Status: DC | PRN
  Administered 2024-03-16: 1.5 mL via INTRATHECAL

## 2024-03-16 MED ORDER — ONDANSETRON HCL 4 MG/2ML IJ SOLN
4.0000 mg | Freq: Three times a day (TID) | INTRAMUSCULAR | Status: DC | PRN
Start: 2024-03-16 — End: 2024-03-19

## 2024-03-16 SURGICAL SUPPLY — 33 items
CHLORAPREP W/TINT 26ML (MISCELLANEOUS) ×2 IMPLANT
CLAMP UMBILICAL CORD (MISCELLANEOUS) ×1 IMPLANT
CLOTH BEACON ORANGE TIMEOUT ST (SAFETY) ×1 IMPLANT
DERMABOND ADVANCED .7 DNX12 (GAUZE/BANDAGES/DRESSINGS) ×1 IMPLANT
DISSECTOR SURG LIGASURE 21 (MISCELLANEOUS) IMPLANT
DRSG OPSITE POSTOP 4X10 (GAUZE/BANDAGES/DRESSINGS) ×1 IMPLANT
ELECTRODE REM PT RTRN 9FT ADLT (ELECTROSURGICAL) ×1 IMPLANT
EXTRACTOR VACUUM BELL STYLE (SUCTIONS) IMPLANT
GAUZE PAD ABD 7.5X8 STRL (GAUZE/BANDAGES/DRESSINGS) IMPLANT
GAUZE SPONGE 4X4 12PLY STRL LF (GAUZE/BANDAGES/DRESSINGS) IMPLANT
GLOVE BIO SURGEON STRL SZ 6.5 (GLOVE) ×1 IMPLANT
GLOVE BIOGEL PI IND STRL 6.5 (GLOVE) ×1 IMPLANT
GLOVE BIOGEL PI IND STRL 7.0 (GLOVE) ×2 IMPLANT
GOWN STRL REUS W/TWL LRG LVL3 (GOWN DISPOSABLE) ×2 IMPLANT
KIT ABG SYR 3ML LUER SLIP (SYRINGE) IMPLANT
MAT PREVALON FULL STRYKER (MISCELLANEOUS) IMPLANT
NDL HYPO 25X5/8 SAFETYGLIDE (NEEDLE) IMPLANT
NEEDLE HYPO 25X5/8 SAFETYGLIDE (NEEDLE) IMPLANT
NS IRRIG 1000ML POUR BTL (IV SOLUTION) ×1 IMPLANT
PACK C SECTION WH (CUSTOM PROCEDURE TRAY) ×1 IMPLANT
PAD ABD 8X10 STRL (GAUZE/BANDAGES/DRESSINGS) IMPLANT
PAD OB MATERNITY 4.3X12.25 (PERSONAL CARE ITEMS) ×1 IMPLANT
PENCIL SMOKE EVAC W/HOLSTER (ELECTROSURGICAL) ×1 IMPLANT
RTRCTR C-SECT PINK 25CM LRG (MISCELLANEOUS) IMPLANT
SUT PDS AB 0 CTX 36 PDP370T (SUTURE) IMPLANT
SUT PLAIN ABS 2-0 CT1 27XMFL (SUTURE) IMPLANT
SUT VIC AB 0 CT1 36 (SUTURE) ×2 IMPLANT
SUT VIC AB 2-0 CT1 TAPERPNT 27 (SUTURE) ×1 IMPLANT
SUT VIC AB 3-0 SH 27X BRD (SUTURE) IMPLANT
SUT VIC AB 4-0 KS 27 (SUTURE) ×1 IMPLANT
TOWEL OR 17X24 6PK STRL BLUE (TOWEL DISPOSABLE) ×1 IMPLANT
TRAY FOLEY W/BAG SLVR 14FR LF (SET/KITS/TRAYS/PACK) ×1 IMPLANT
WATER STERILE IRR 1000ML POUR (IV SOLUTION) ×1 IMPLANT

## 2024-03-16 NOTE — Transfer of Care (Signed)
 Immediate Anesthesia Transfer of Care Note  Patient: Kathy Tyler  Procedure(s) Performed: CESAREAN SECTION, WITH BILATERAL TUBAL LIGATION (Bilateral)  Patient Location: PACU  Anesthesia Type:Spinal  Level of Consciousness: awake, alert , and oriented  Airway & Oxygen Therapy: Patient Spontanous Breathing  Post-op Assessment: Report given to RN and Post -op Vital signs reviewed and stable  Post vital signs: Reviewed and stable  Last Vitals:  Vitals Value Taken Time  BP 98/69 03/16/24 09:00  Temp    Pulse 67 03/16/24 09:04  Resp 20 03/16/24 09:04  SpO2 98 % 03/16/24 09:04  Vitals shown include unfiled device data.  Last Pain:  Vitals:   03/16/24 0550  TempSrc: Oral  PainSc: 0-No pain         Complications: No notable events documented.

## 2024-03-16 NOTE — Anesthesia Procedure Notes (Signed)
 Spinal  Patient location during procedure: OR Start time: 03/16/2024 7:38 AM End time: 03/16/2024 7:42 AM Reason for block: surgical anesthesia Staffing Performed: anesthesiologist  Anesthesiologist: Jacquelyne Matte, DO Performed by: Jacquelyne Matte, DO Authorized by: Jacquelyne Matte, DO   Preanesthetic Checklist Completed: patient identified, IV checked, risks and benefits discussed, surgical consent, monitors and equipment checked, pre-op evaluation and timeout performed Spinal Block Patient position: sitting Prep: DuraPrep and site prepped and draped Patient monitoring: cardiac monitor, continuous pulse ox and blood pressure Approach: midline Location: L3-4 Injection technique: single-shot Needle Needle type: Pencan  Needle gauge: 24 G Needle length: 9 cm Assessment Sensory level: T6 Events: CSF return Additional Notes Functioning IV was confirmed and monitors were applied. Sterile prep and drape, including hand hygiene and sterile gloves were used. The patient was positioned and the spine was prepped. The skin was anesthetized with lidocaine .  Free flow of clear CSF was obtained prior to injecting local anesthetic into the CSF.  The spinal needle aspirated freely following injection.  The needle was carefully withdrawn.  The patient tolerated the procedure well.

## 2024-03-16 NOTE — Op Note (Signed)
 C-Section Operative Note  Date: 03/16/24  Preoperative Diagnosis: IUP @ 39 1/7 Postoperative Diagnosis: same as above Procedure: RLTCS with BS Indication: H/o section x2, satisfied fertility Surgeon: Ethridge Herder, MD Findings: Viable female infant weighing 8lb15oz with APGARS of 9 and 9 at 1 and 5 minutes, respectively. Normal appearing uterus, bilateral fallopian tubes and ovaries. Specimens: bilateral fallopian tubes and placenta to pathology EBL 712cc IVF 1700 UOP 300  Patient Course: H/o csx x2 and satisfied fertility presenting for scheduled section. Bilateral salpingectomy planned for satisfied fertility. H/o DVT in pregnancy on Lovenox , last dose 9PM yesterday  Consent:  R/B/A of cesarean section discussed with patient. Alternative would be vaginal delivery which would mean shorter postpartum stay and decreased risk of bleeding. Risks of section include infection of the uterus, pelvic organs, or skin, inadvertent injury to internal organs, such as bowel or bladder. If there is major injury, extensive surgery may be required. If injury is minor, it may be treated with relative ease. Discussed possibility of excessive blood loss and transfusion. If bleeding cannot be controlled using medical or minor surgical methods, a cesarean hysterectomy may be performed which would mean no future fertility. Patient accepts the possibility of blood transfusion, if necessary. Patient understands and agrees to move forward with section.   Operative Procedure: Patient was taken to the operating room where epidural anesthesia was found to be adequate by Allis clamp test. She was prepped and draped in the normal sterile fashion in the dorsal supine position with a leftward tilt. An appropriate time out was performed. A Pfannenstiel skin incision was then made with the scalpel and carried through to the underlying layer of fascia by sharp dissection and Bovie cautery. The fascia was nicked in the midline and  the incision was extended laterally with Mayo scissors. The superior aspect of the incision was grasped Cpker clamps and dissected off the underlying rectus muscles. In a similar fashion the inferior aspect was dissected off the rectus muscles. Rectus muscles were separated in the midline and the peritoneal cavity entered bluntly. The peritoneal incision was then extended both superiorly and inferiorly with careful attention to avoid both bowel and bladder. The Alexis self-retaining wound retractor was then placed within the incision and the lower uterine segment exposed. The bladder flap was developed with Metzenbaum scissors and pushed away from the lower uterine segment. The lower uterine segment was then incised in a low transverse fashion and the cavity itself entered bluntly. The incision was extended bluntly. Amniotic sac was ruptured and fluid was noted to be clear in color. The infant's head was then lifted and delivered from the incision without difficulty using the standard movements. The remainder of the infant delivered and the nose and mouth bulb suctioned with the cord clamped and cut as well, nuchal x1 reduced. The infant was handed off to NICU. TIV TXA and IM methergine x1 given brief atony, good response to meds and massage. The placenta was then spontaneously expressed from the uterus and the uterus cleared of all clots and debris with moist lap sponge. The uterine incision was then repaired in 2 layers the first layer was a running locked layer of 0-vicryl and the second an imbricating layer of the same suture. The tubes and ovaries were inspected and the gutters cleared of all clots and debris. The uterine incision was inspected and found to be hemostatic. Bilateral salpingectomy was then carried out starting at left cornua and tracing fallopian tube out to fimbriae with Babcock clamps. Mesosalpinx  transected with small Ligasure. Process repeated on right side. Both tubes sent to pathology. All  instruments and sponges as well as the Alexis retractor were then removed from the abdomen. The peritoneum was then reapproximated using 2-0 vicryl suture. The fascia was then closed with 0 Vicryl in a running fashion. The skin was closed with a subcuticular stitch of 4-0 Vicryl on a Keith needle and then reinforced with Dermabond and a pressure dressing. At the conclusion of the procedure all instruments and sponge counts were correct. Patient was taken to the recovery room in good condition with her baby accompanying her skin to skin.

## 2024-03-16 NOTE — Interval H&P Note (Signed)
 History and Physical Interval Note:  03/16/2024 7:29 AM  Kathy Tyler  has presented today for surgery, with the diagnosis of elective repeat cesarean section and desired infertility.  The various methods of treatment have been discussed with the patient and family. After consideration of risks, benefits and other options for treatment, the patient has consented to  Procedure(s): CESAREAN SECTION, WITH BILATERAL TUBAL LIGATION (Bilateral) as a surgical intervention.  The patient's history has been reviewed, patient examined, no change in status, stable for surgery.  I have reviewed the patient's chart and labs.  Questions were answered to the patient's satisfaction.     Dorise Gangi M Berneda Piccininni

## 2024-03-16 NOTE — Anesthesia Postprocedure Evaluation (Signed)
 Anesthesia Post Note  Patient: Kathy Tyler  Procedure(s) Performed: CESAREAN SECTION, WITH BILATERAL TUBAL LIGATION (Bilateral)     Patient location during evaluation: PACU Anesthesia Type: Spinal Level of consciousness: awake and alert and oriented Pain management: pain level controlled Vital Signs Assessment: post-procedure vital signs reviewed and stable Respiratory status: spontaneous breathing, nonlabored ventilation and respiratory function stable Cardiovascular status: blood pressure returned to baseline and stable Postop Assessment: no headache, no backache, spinal receding and patient able to bend at knees Anesthetic complications: no   No notable events documented.  Last Vitals:  Vitals:   03/16/24 0956 03/16/24 1004  BP: 107/71 (!) 96/57  Pulse: 61 64  Resp: 13 18  Temp: 36.6 C 36.7 C  SpO2: 99% 99%    Last Pain:  Vitals:   03/16/24 1004  TempSrc: Axillary  PainSc:                  Kathy Tyler

## 2024-03-16 NOTE — Lactation Note (Signed)
 This note was copied from a baby's chart. Lactation Consultation Note  Patient Name: Kathy Tyler [id number] Date: 03/16/2024 Age:41 hours Reason for consult: Initial assessment;Term (See [name]: MR-AMA, Anemia and C/S delivery).  Per [name], infant is cluster feeding, [name] expressed a few drops of colostrum that was spoon fed to infant, infant was fussy but was calmer. Infant BF using the cross  cradle hold for 20 minutes and infant was supplemented with  10 mls of donor breast milk at the breast with curve tip syringe. [name] would like to be set up with DEBP in morning but not tonight, [name] is aware when using donor breast milk she should use the DEBP. [name] will continue to breastfeed infant by cues, on demand, 8+ times within 24 hours, skin to skin.   Maternal Data Has patient been taught Hand Expression?: Yes Does the patient have breastfeeding experience prior to this delivery?: Yes How long did the patient breastfeed?: Per [name], she breastfeed 1st child for 6 months and 2nd child for 7 months.  Feeding Mother's Current Feeding Choice: Breast Milk and Donor Milk  LATCH Score Latch: Grasps breast easily, tongue down, lips flanged, rhythmical sucking. ([name] used curve tip syringe and supplemented infant at the breast with donor breast milk.)  Audible Swallowing: A few with stimulation  Type of Nipple: Everted at rest and after stimulation  Comfort (Breast/Nipple): Soft / non-tender  Hold (Positioning): Assistance needed to correctly position infant at breast and maintain latch.  LATCH Score: 8   Lactation Tools Discussed/Used    Interventions Interventions: Breast feeding basics reviewed;Assisted with latch;Skin to skin;Breast compression;Adjust position;Support pillows;Position options;Expressed milk;Education  Discharge Pump: DEBP;Personal  Consult Status Consult Status: Follow-up Date: 03/17/24 Follow-up type: In-patient    Pecolia Bourbon 03/16/2024, 11:50 PM

## 2024-03-17 LAB — CBC
HCT: 31.6 % — ABNORMAL LOW (ref 36.0–46.0)
Hemoglobin: 10.7 g/dL — ABNORMAL LOW (ref 12.0–15.0)
MCH: 33 pg (ref 26.0–34.0)
MCHC: 33.9 g/dL (ref 30.0–36.0)
MCV: 97.5 fL (ref 80.0–100.0)
Platelets: 240 10*3/uL (ref 150–400)
RBC: 3.24 MIL/uL — ABNORMAL LOW (ref 3.87–5.11)
RDW: 15.9 % — ABNORMAL HIGH (ref 11.5–15.5)
WBC: 14.7 10*3/uL — ABNORMAL HIGH (ref 4.0–10.5)
nRBC: 0 % (ref 0.0–0.2)

## 2024-03-17 NOTE — Social Work (Signed)
 [name] was referred for history of depression/anxiety.  * Referral screened out by Clinical Social Worker because none of the following criteria appear to apply:  ~ History of anxiety/depression during this pregnancy, or of post-partum depression following prior delivery.  ~ Diagnosis of anxiety and/or depression within last 3 years OR * [name]'s symptoms currently being treated with medication and/or therapy. Per chart review [name] has an active prescription and is taking sertraline 50mg . Edinburgh=1  Please contact the Clinical Social Worker if needs arise, or by [name] request.   Haroldine Likens, LCSWA Clinical Social Worker [phone]

## 2024-03-17 NOTE — Progress Notes (Addendum)
 Subjective: Postpartum Day 1: Cesarean Delivery Patient reports pain well controlled. Has been out of bed, voiding well. Tolerating PO. Lochia appropriate. Breastfeeding.   Objective: Vital signs in last 24 hours: Temp:  [96.6 F (35.9 C)-98.7 F (37.1 C)] 98.7 F (37.1 C) (06/18 0521) Pulse Rate:  [65-67] 66 (06/18 0521) Resp:  [16-18] 18 (06/17 2203) BP: (92-104)/(52-64) 98/62 (06/18 0521) SpO2:  [96 %-100 %] 100 % (06/18 0521)  Physical Exam:  General: alert, cooperative, and no distress Uterine Fundus: firm Incision: clean, dry, intact DVT Evaluation: No evidence of DVT seen on physical exam.  Recent Labs    03/16/24 1038 03/17/24 0545  HGB 13.1 10.7*  HCT 38.7 31.6*    Assessment/Plan: Kathy Tyler is a 42yo G5 now P3023 s/p  scheduled ERLTCS+BS @ 39 1/7. -POD1: routine post-op/postpartum care - h/o DVT in pregnancy: prophylactic Lovenox  per Dr. Alain Howard. Per patient, plan was for 6 weeks postpartum. - depression: continue sertraline - palpitations: currently asymptomatic - desire circumcision: Mother desires neonatal circumcision, R/B/A of procedure discussed at length. Mother understands that neonatal circumcision is not considered medically necessary and is elective. The risks include, but are not limited to bleeding, infection, damage to the penis, development of scar tissue, and having to have it redone at a later date. Mother understands these risks and wishes to proceed   Dispo: Anticipate discharge on POD2-3.  Kathy Ana, MD 03/17/2024, 10:15 AM   Addendum: Circumcision deferred for feeding optimization

## 2024-03-18 LAB — SURGICAL PATHOLOGY

## 2024-03-18 NOTE — Progress Notes (Signed)
 Patient is doing well.  She is tolerating PO, ambulating, voiding.  Pain is controlled.  Lochia is appropriate.  Reports cluster feeding overnight  Vitals:   03/16/24 2203 03/17/24 0521 03/17/24 2115 03/18/24 0511  BP: (!) 92/52 98/62 (!) 92/55 106/64  Pulse: 65 66 79 72  Resp: 18  18 18   Temp:  98.7 F (37.1 C) 97.8 F (36.6 C) 98 F (36.7 C)  TempSrc:  Oral Oral Oral  SpO2: 97% 100%  99%  Weight:      Height:        NAD Abdomen:  soft, appropriate tenderness, incisions intact and without erythema or drainage ext:    Symmetric, no edema bilaterally  Lab Results  Component Value Date   WBC 14.7 (H) 03/17/2024   HGB 10.7 (L) 03/17/2024   HCT 31.6 (L) 03/17/2024   MCV 97.5 03/17/2024   PLT 240 03/17/2024    --/--/A POS (06/16 4098)  A/P    42 y.o. [id number] POD #2 s/p RCS and BS Routine post op and postpartum care.  Desires discharge tomorrow h/o DVT in pregnancy: prophylactic Lovenox  per Dr. Alain Howard. Per patient, plan was for 6 weeks postpartum. Depression: continue sertraline  Desire circumcision: Mother desires neonatal circumcision, R/B/A of procedure discussed at length. Mother understands that neonatal circumcision is not considered medically necessary and is elective. The risks include, but are not limited to bleeding, infection, damage to the penis, development of scar tissue, and having to have it redone at a later date. Mother understands these risks and wishes to proceed

## 2024-03-19 LAB — HEPARIN ANTI-XA: Heparin LMW: <0.1 [IU]/mL

## 2024-03-19 MED ORDER — ENOXAPARIN SODIUM 40 MG/0.4ML IJ SOSY: 40.0000 mg | PREFILLED_SYRINGE | INTRAMUSCULAR | 1 refills | Status: AC

## 2024-03-19 MED ORDER — OXYCODONE HCL 5 MG PO TABS: 5.0000 mg | ORAL_TABLET | ORAL | 0 refills | Status: AC | PRN

## 2024-03-19 MED ORDER — IBUPROFEN 600 MG PO TABS: 600.0000 mg | ORAL_TABLET | Freq: Four times a day (QID) | ORAL | 0 refills | Status: AC | PRN

## 2024-03-19 NOTE — Plan of Care (Signed)

## 2024-03-19 NOTE — Discharge Instructions (Signed)

## 2024-03-19 NOTE — Discharge Summary (Signed)
 Postpartum Discharge Summary       Patient Name: Kathy Tyler DOB: 02-Jan-1981 MRN: [id number]  Date of admission: 03/16/2024 Delivery date:03/16/2024 Delivering provider: Gaspar Karma Date of discharge: 03/19/2024  Admitting diagnosis: History of cesarean section [Z98.891] Intrauterine pregnancy: [redacted]w[redacted]d     Secondary diagnosis:  Principal Problem:   History of cesarean section     Discharge diagnosis: Term Pregnancy Delivered                                              Post partum procedures:Not applicable Augmentation: N/A Complications: None  Hospital course: Sceduled C/S   43 y.o. yo [id number] at [redacted]w[redacted]d was admitted to the hospital 03/16/2024 for scheduled cesarean section with the following indication:Elective Repeat.Delivery details are as follows:  Membrane Rupture Time/Date: 8:04 AM,03/16/2024  Delivery Method:C-Section, Low Transverse Operative Delivery:N/A Details of operation can be found in separate operative note.  Patient had a postpartum course complicated by nothing.  She is ambulating, tolerating a regular diet, passing flatus, and urinating well. Patient is discharged home in stable condition on  03/19/24        Newborn Data: Birth date:03/16/2024 Birth time:8:04 AM Gender:Female Living status:Living Apgars:9 ,9  Weight:4060 g    Magnesium Sulfate received: No BMZ received: No Rhophylac:N/A Immunizations administered: There is no immunization history for the selected administration types on file for this patient.  Physical exam  Vitals:   03/18/24 0511 03/18/24 1715 03/18/24 2112 03/19/24 0515  BP: 106/64 106/64 (!) 115/54 (!) 106/56  Pulse: 72 79 87 66  Resp: 18 16 16    Temp: 98 F (36.7 C) 98.4 F (36.9 C) 98.5 F (36.9 C) 98.3 F (36.8 C)  TempSrc: Oral Oral Oral Oral  SpO2: 99% 97% 98% 99%  Weight:      Height:       General: alert, cooperative, and no distress Lochia: appropriate Uterine Fundus: firm Incision:  Healing well with no significant drainage DVT Evaluation: No evidence of DVT seen on physical exam. Labs: Lab Results  Component Value Date   WBC 14.7 (H) 03/17/2024   HGB 10.7 (L) 03/17/2024   HCT 31.6 (L) 03/17/2024   MCV 97.5 03/17/2024   PLT 240 03/17/2024      Latest Ref Rng & Units 03/16/2024   10:38 AM  CMP  Creatinine 0.44 - 1.00 mg/dL 6.29    Edinburgh Score:    03/17/2024    5:18 AM  Edinburgh Postnatal Depression Scale Screening Tool  I have been able to laugh and see the funny side of things. 0  I have looked forward with enjoyment to things. 0  I have blamed myself unnecessarily when things went wrong. 0  I have been anxious or worried for no good reason. 0  I have felt scared or panicky for no good reason. 0  Things have been getting on top of me. 1  I have been so unhappy that I have had difficulty sleeping. 0  I have felt sad or miserable. 0  I have been so unhappy that I have been crying. 0  The thought of harming myself has occurred to me. 0  Edinburgh Postnatal Depression Scale Total 1      After visit meds:  Allergies as of 03/19/2024       Reactions   Tobramycin Rash   Eyelid ointment  Medication List     STOP taking these medications    aspirin 81 MG chewable tablet       TAKE these medications    enoxaparin  40 MG/0.4ML injection Commonly known as: LOVENOX  Inject 0.4 mLs (40 mg total) into the skin daily.   fluticasone 50 MCG/ACT nasal spray Commonly known as: FLONASE Place 1 spray into both nostrils at bedtime.   ibuprofen  600 MG tablet Commonly known as: ADVIL  Take 1 tablet (600 mg total) by mouth every 6 (six) hours as needed.   oxyCODONE  5 MG immediate release tablet Commonly known as: Roxicodone  Take 1 tablet (5 mg total) by mouth every 4 (four) hours as needed.   PRENATAL+DHA PO Take 1 tablet by mouth daily.   sertraline 50 MG tablet Commonly known as: ZOLOFT Take 50 mg by mouth at bedtime.                Discharge Care Instructions  (From admission, onward)           Start     Ordered   03/19/24 0000  Discharge wound care:       Comments: For a cesarean delivery: You may wash incision with soap and water.  Do not soak or submerge the incision for 2 weeks. Keep incision dry. You may need to keep a sanitary pad or panty liner between the incision and your clothing for comfort and to keep the incision dry. If you note drainage, increased pain, or increased redness of the incision, then please notify your physician.   03/19/24 0918   03/19/24 0000  If the dressing is still on your incision site when you go home, remove it on the third day after your surgery date. Remove dressing if it begins to fall off, or if it is dirty or damaged before the third day.       Comments: For a cesarean delivery   03/19/24 0918             Discharge home in stable condition Infant Feeding: Breast Infant Disposition:home with mother Discharge instruction: per After Visit Summary and Postpartum booklet. Activity: Advance as tolerated. Pelvic rest for 6 weeks.  Diet: routine diet Anticipated Birth Control: Unsure Postpartum Appointment:4 weeks Additional Postpartum F/U:  Future Appointments:No future appointments. Follow up Visit:      03/19/2024 Reggy Capers, MD

## 2024-03-19 NOTE — Lactation Note (Signed)
 This note was copied from a baby's chart. Lactation Consultation Note  Patient Name: Kathy Tyler [id number] Date: 03/19/2024 Age:41 hours  Reason for consult: Follow-up assessment;Term;Breastfeeding assistance;Infant weight loss  P3, [redacted]w[redacted]d, 7% weight loss  Follow up LC visit at mother's request. She states baby is latching well but gives up, quits sucking after a short time. She feels he is preferring the flow of the donor milk from a bottle.   Mother latched baby. She was able to express colostrum by hand prior to latch. Her breast are filling. She reports h/o  producing milk but takes a few days for her milk to come to volume. Baby required coaxing to continue to suckle.  Mother was receptive to using a 5 french feeding tube with an attached syringe filled with milk at the breast when baby is latched. Demonstrated the technique while baby was latched but DBM was not available to use. Mother felt confident that she can use. She plans to get DBM from her sister-n-law today for supplementing with or after breastfeeding until milk comes to volume and infant is feeding well at the breast.   Mom made aware of O/P services, breastfeeding support groups, community resources, and our phone # for post-discharge questions.     Feeding Mother's Current Feeding Choice: Breast Milk and Donor Milk  LATCH Score Latch: Grasps breast easily, tongue down, lips flanged, rhythmical sucking.  Audible Swallowing: A few with stimulation  Type of Nipple: Everted at rest and after stimulation  Comfort (Breast/Nipple): Soft / non-tender  Hold (Positioning): Assistance needed to correctly position infant at breast and maintain latch.  LATCH Score: 8     Interventions Interventions: Breast feeding basics reviewed;Breast compression;Education  Discharge Discharge Education: Warning signs for feeding baby Pump: DEBP;Personal  Consult Status Consult Status: Complete Date: 03/19/24    Esperanza Hedges 03/19/2024, 10:16 AM

## 2024-03-19 NOTE — Plan of Care (Signed)
 Problem: Education: Goal: Knowledge of General Education information will improve Description: Including pain rating scale, medication(s)/side effects and non-pharmacologic comfort measures 03/19/2024 1029 by Osvaldo Blender, LPN Outcome: Adequate for Discharge 03/19/2024 0720 by Osvaldo Blender, LPN Outcome: Progressing   Problem: Health Behavior/Discharge Planning: Goal: Ability to manage health-related needs will improve 03/19/2024 1029 by Osvaldo Blender, LPN Outcome: Adequate for Discharge 03/19/2024 0720 by Osvaldo Blender, LPN Outcome: Progressing   Problem: Clinical Measurements: Goal: Ability to maintain clinical measurements within normal limits will improve 03/19/2024 1029 by Osvaldo Blender, LPN Outcome: Adequate for Discharge 03/19/2024 0720 by Osvaldo Blender, LPN Outcome: Progressing Goal: Will remain free from infection 03/19/2024 1029 by Osvaldo Blender, LPN Outcome: Adequate for Discharge 03/19/2024 0720 by Osvaldo Blender, LPN Outcome: Progressing Goal: Diagnostic test results will improve 03/19/2024 1029 by Osvaldo Blender, LPN Outcome: Adequate for Discharge 03/19/2024 0720 by Osvaldo Blender, LPN Outcome: Progressing Goal: Respiratory complications will improve 03/19/2024 1029 by Osvaldo Blender, LPN Outcome: Adequate for Discharge 03/19/2024 0720 by Osvaldo Blender, LPN Outcome: Progressing Goal: Cardiovascular complication will be avoided 03/19/2024 1029 by Osvaldo Blender, LPN Outcome: Adequate for Discharge 03/19/2024 0720 by Osvaldo Blender, LPN Outcome: Progressing   Problem: Activity: Goal: Risk for activity intolerance will decrease 03/19/2024 1029 by Osvaldo Blender, LPN Outcome: Adequate for Discharge 03/19/2024 0720 by Osvaldo Blender, LPN Outcome: Progressing   Problem: Nutrition: Goal: Adequate nutrition will be maintained 03/19/2024 1029 by Osvaldo Blender, LPN Outcome: Adequate for Discharge 03/19/2024 0720 by Osvaldo Blender, LPN Outcome: Progressing    Problem: Coping: Goal: Level of anxiety will decrease 03/19/2024 1029 by Osvaldo Blender, LPN Outcome: Adequate for Discharge 03/19/2024 0720 by Osvaldo Blender, LPN Outcome: Progressing   Problem: Elimination: Goal: Will not experience complications related to bowel motility 03/19/2024 1029 by Osvaldo Blender, LPN Outcome: Adequate for Discharge 03/19/2024 0720 by Osvaldo Blender, LPN Outcome: Progressing Goal: Will not experience complications related to urinary retention 03/19/2024 1029 by Osvaldo Blender, LPN Outcome: Adequate for Discharge 03/19/2024 0720 by Osvaldo Blender, LPN Outcome: Progressing   Problem: Pain Managment: Goal: General experience of comfort will improve and/or be controlled 03/19/2024 1029 by Osvaldo Blender, LPN Outcome: Adequate for Discharge 03/19/2024 0720 by Osvaldo Blender, LPN Outcome: Progressing   Problem: Safety: Goal: Ability to remain free from injury will improve 03/19/2024 1029 by Osvaldo Blender, LPN Outcome: Adequate for Discharge 03/19/2024 0720 by Osvaldo Blender, LPN Outcome: Progressing   Problem: Skin Integrity: Goal: Risk for impaired skin integrity will decrease 03/19/2024 1029 by Osvaldo Blender, LPN Outcome: Adequate for Discharge 03/19/2024 0720 by Osvaldo Blender, LPN Outcome: Progressing   Problem: Education: Goal: Knowledge of the prescribed therapeutic regimen will improve 03/19/2024 1029 by Osvaldo Blender, LPN Outcome: Adequate for Discharge 03/19/2024 0720 by Osvaldo Blender, LPN Outcome: Progressing Goal: Understanding of sexual limitations or changes related to disease process or condition will improve 03/19/2024 1029 by Osvaldo Blender, LPN Outcome: Adequate for Discharge 03/19/2024 0720 by Osvaldo Blender, LPN Outcome: Progressing Goal: Individualized Educational Video(s) 03/19/2024 1029 by Osvaldo Blender, LPN Outcome: Adequate for Discharge 03/19/2024 0720 by Osvaldo Blender, LPN Outcome: Progressing   Problem:  Self-Concept: Goal: Communication of feelings regarding changes in body function or appearance will improve 03/19/2024 1029 by Osvaldo Blender, LPN Outcome: Adequate for Discharge 03/19/2024 0720 by Osvaldo Blender, LPN Outcome: Progressing  Problem: Skin Integrity: Goal: Demonstration of wound healing without infection will improve 03/19/2024 1029 by Osvaldo Blender, LPN Outcome: Adequate for Discharge 03/19/2024 0720 by Osvaldo Blender, LPN Outcome: Progressing   Problem: Education: Goal: Knowledge of condition will improve 03/19/2024 1029 by Osvaldo Blender, LPN Outcome: Adequate for Discharge 03/19/2024 0720 by Osvaldo Blender, LPN Outcome: Progressing Goal: Individualized Educational Video(s) 03/19/2024 1029 by Osvaldo Blender, LPN Outcome: Adequate for Discharge 03/19/2024 0720 by Osvaldo Blender, LPN Outcome: Progressing Goal: Individualized Newborn Educational Video(s) 03/19/2024 1029 by Osvaldo Blender, LPN Outcome: Adequate for Discharge 03/19/2024 0720 by Osvaldo Blender, LPN Outcome: Progressing   Problem: Activity: Goal: Will verbalize the importance of balancing activity with adequate rest periods 03/19/2024 1029 by Osvaldo Blender, LPN Outcome: Adequate for Discharge 03/19/2024 0720 by Osvaldo Blender, LPN Outcome: Progressing Goal: Ability to tolerate increased activity will improve 03/19/2024 1029 by Osvaldo Blender, LPN Outcome: Adequate for Discharge 03/19/2024 0720 by Osvaldo Blender, LPN Outcome: Progressing   Problem: Coping: Goal: Ability to identify and utilize available resources and services will improve 03/19/2024 1029 by Osvaldo Blender, LPN Outcome: Adequate for Discharge 03/19/2024 0720 by Osvaldo Blender, LPN Outcome: Progressing   Problem: Life Cycle: Goal: Chance of risk for complications during the postpartum period will decrease 03/19/2024 1029 by Osvaldo Blender, LPN Outcome: Adequate for Discharge 03/19/2024 0720 by Osvaldo Blender, LPN Outcome: Progressing    Problem: Role Relationship: Goal: Ability to demonstrate positive interaction with newborn will improve 03/19/2024 1029 by Osvaldo Blender, LPN Outcome: Adequate for Discharge 03/19/2024 0720 by Osvaldo Blender, LPN Outcome: Progressing   Problem: Skin Integrity: Goal: Demonstration of wound healing without infection will improve 03/19/2024 1029 by Osvaldo Blender, LPN Outcome: Adequate for Discharge 03/19/2024 0720 by Osvaldo Blender, LPN Outcome: Progressing

## 2024-03-25 ENCOUNTER — Telehealth (HOSPITAL_COMMUNITY): Payer: Self-pay | Admitting: *Deleted

## 2024-03-25 NOTE — Telephone Encounter (Signed)
 03/25/2024  Name: Kathy Tyler MRN: [id number] DOB: 1981-04-01  Reason for Call:  Transition of Care Hospital Discharge Call  Contact Status: Patient Contact Status: Complete  Language assistant needed: Interpreter Mode: Interpreter Not Needed        Follow-Up Questions: Do You Have Any Concerns About Your Health As You Heal From Delivery?: No Do You Have Any Concerns About Your Infants Health?: No  Edinburgh Postnatal Depression Scale:  In the Past 7 Days: I have been able to laugh and see the funny side of things.: As much as I always could I have looked forward with enjoyment to things.: As much as I ever did I have blamed myself unnecessarily when things went wrong.: No, never I have been anxious or worried for no good reason.: No, not at all I have felt scared or panicky for no good reason.: No, not at all Things have been getting on top of me.: No, I have been coping as well as ever I have been so unhappy that I have had difficulty sleeping.: Not at all I have felt sad or miserable.: Not very often I have been so unhappy that I have been crying.: Only occasionally The thought of harming myself has occurred to me.: Never Edinburgh Postnatal Depression Scale Total: 2  PHQ2-9 Depression Scale:     Discharge Follow-up: Edinburgh score requires follow up?: No Patient was advised of the following resources:: Support Group, Breastfeeding Support Group  Post-discharge interventions: Reviewed Newborn Safe Sleep Practices  Mliss Sieve, RN 03/25/2024 10:57

## 2024-07-13 ENCOUNTER — Encounter: Payer: Self-pay | Admitting: *Deleted

## 2024-07-16 ENCOUNTER — Encounter: Payer: Self-pay | Admitting: Cardiology

## 2024-07-16 ENCOUNTER — Ambulatory Visit: Attending: Cardiology | Admitting: Cardiology

## 2024-07-16 VITALS — BP 90/60 | HR 70 | Ht 62.0 in | Wt 124.6 lb

## 2024-07-16 DIAGNOSIS — Z131 Encounter for screening for diabetes mellitus: Secondary | ICD-10-CM

## 2024-07-16 DIAGNOSIS — I471 Supraventricular tachycardia, unspecified: Secondary | ICD-10-CM

## 2024-07-16 DIAGNOSIS — E782 Mixed hyperlipidemia: Secondary | ICD-10-CM

## 2024-07-16 LAB — LIPID PANEL

## 2024-07-16 NOTE — Patient Instructions (Signed)
 Medication Instructions:   No changes  *If you need a refill on your cardiac medications before your next appointment, please call your pharmacy*   Lab Work: HgbA1c Lipid LP(a)  If you have labs (blood work) drawn today and your tests are completely normal, you will receive your results only by: MyChart Message (if you have MyChart) OR A paper copy in the mail If you have any lab test that is abnormal or we need to change your treatment, we will call you to review the results.   Testing/Procedures: Not needed   Follow-Up: At [facility], you and your health needs are our priority.  As part of our continuing mission to provide you with exceptional heart care, we have created designated Provider Care Teams.  These Care Teams include your primary Cardiologist (physician) and Advanced Practice Providers (APPs -  Physician Assistants and Nurse Practitioners) who all work together to provide you with the care you need, when you need it.     Your next appointment:   As needed   The format for your next appointment:   In Person  Provider:   Kardie Tobb, MD

## 2024-07-16 NOTE — Progress Notes (Signed)
 Cardio[facility]stetrics Clinic  Follow Up Note   Date:  07/16/2024   ID:  Kathy Tyler, DOB 1981-08-07, MRN [id number]  PCP:  Sudie Lavonia HERO, MD   [facility] Providers Cardiologist:  Oneil Parchment, MD  Electrophysiologist:  None        Referring MD: Sudie Lavonia HERO, MD   Chief Complaint:  I am doing well   History of Present Illness:    Kathy Tyler is a 43 y.o. female [[id number]] who returns for follow up.   Medical hx includes dyslipidemia,   She is 4 months postpartum and doing well. She offers no complaints. Palpitations has resolved since delivery.   Recently short lived lightheadedness in the setting of sinus congestion. Otherwise this was intermittent and did not persist.   Prior CV Studies Reviewed: The following studies were reviewed today: Zio monitor   Past Medical History:  Diagnosis Date   Anemia    Anxiety    general and pp   Depression    pp anx and depression   DVT (deep vein thrombosis) in pregnancy    12/18   Dyslipidemia    Hypercholesterolemia    Mental disorder    Skipped heart beats     Past Surgical History:  Procedure Laterality Date   CESAREAN SECTION N/A 12/21/2015   Procedure: CESAREAN SECTION;  Surgeon: Truman Corona, MD;  Location: [facility];  Service: Obstetrics;  Laterality: N/A;   CESAREAN SECTION N/A 01/06/2018   Procedure: REPEAT CESAREAN SECTION;  Surgeon: Corona Truman, MD;  Location: [facility];  Service: Obstetrics;  Laterality: N/A;  Repeat edc 01/12/18 allergy to tobramycin, ophthalmic solution Tracey RNFA   CESAREAN SECTION WITH BILATERAL TUBAL LIGATION Bilateral 03/16/2024   Procedure: CESAREAN SECTION, WITH BILATERAL TUBAL LIGATION;  Surgeon: Sudie Lavonia HERO, MD;  Location: [facility] OR[facility];  Service: Obstetrics;  Laterality: Bilateral;   WISDOM TOOTH EXTRACTION  2007      OB History     Gravida  5   Para  3   Term  3   Preterm      AB  2   Living  3       SAB  2   IAB      Ectopic      Multiple  0   Live Births  3               Current Medications: Current Meds  Medication Sig   fluticasone (FLONASE) 50 MCG/ACT nasal spray Place 1 spray into both nostrils at bedtime.   sertraline  (ZOLOFT ) 50 MG tablet Take 50 mg by mouth at bedtime.     Allergies:   Tobramycin   Social History   Socioeconomic History   Marital status: Married    Spouse name: Not on file   Number of children: Not on file   Years of education: Not on file   Highest education level: Not on file  Occupational History   Not on file  Tobacco Use   Smoking status: Never   Smokeless tobacco: Never  Vaping Use   Vaping status: Never Used  Substance and Sexual Activity   Alcohol use: No   Drug use: No   Sexual activity: Yes    Birth control/protection: None  Other Topics Concern   Not on file  Social History Narrative   Not on file   Social Drivers of Health   Financial Resource Strain: Not on file  Food Insecurity: No Food Insecurity (  03/16/2024)   Hunger Vital Sign    Worried About Running Out of Food in the Last Year: Never true    Ran Out of Food in the Last Year: Never true  Transportation Needs: No Transportation Needs (03/16/2024)   PRAPARE - Administrator, Civil Service (Medical): No    Lack of Transportation (Non-Medical): No  Physical Activity: Not on file  Stress: Not on file  Social Connections: Not on file      Family History  Problem Relation Age of Onset   Atrial fibrillation Mother    Heart disease Maternal Grandmother    Diabetes Maternal Grandmother    Bladder Cancer Maternal Grandmother    Stroke Maternal Grandfather    Colon cancer Maternal Grandfather    Breast cancer Paternal Grandmother    Heart disease Paternal Grandfather    Heart attack Paternal Grandfather    Hypertension Paternal Grandfather       ROS:   Please see the history of present illness.     All other systems reviewed and are  negative.   Labs/EKG Reviewed:    EKG:   EKG ordered today.  The ekg ordered today demonstrates sinus rhythm with noted incomplete right bundle branch block.   Recent Labs: 03/16/2024: Creatinine, Ser 0.68 03/17/2024: Hemoglobin 10.7; Platelets 240   Recent Lipid Panel No results found for: CHOL, TRIG, HDL, CHOLHDL, LDLCALC, LDLDIRECT  Physical Exam:    VS:  BP 90/60 (BP Location: Right Arm, Patient Position: Sitting, Cuff Size: Normal)   Pulse 70   Ht 5' 2 (1.575 m)   Wt 124 lb 9.6 oz (56.5 kg)   SpO2 99%   BMI 22.79 kg/m     Wt Readings from Last 3 Encounters:  07/16/24 124 lb 9.6 oz (56.5 kg)  03/16/24 160 lb 1.6 oz (72.6 kg)  03/05/24 159 lb (72.1 kg)     GEN:  Well nourished, well developed in no acute distress HEENT: Normal NECK: No JVD; No carotid bruits LYMPHATICS: No lymphadenopathy CARDIAC: RRR, no murmurs, rubs, gallops RESPIRATORY:  Clear to auscultation without rales, wheezing or rhonchi  ABDOMEN: Soft, non-tender, non-distended MUSCULOSKELETAL:  No edema; No deformity  SKIN: Warm and dry NEUROLOGIC:  Alert and oriented x 3 PSYCHIATRIC:  Normal affect    Risk Assessment/Risk Calculators:                 ASSESSMENT & PLAN:    PSVT - she is doing well. Asymptomatic. No need for AV nodal blockers at this time   Dyslipidemia -  need repeat labs. Will also screen for diabetes.   Follow up as needed  Patient Instructions  Medication Instructions:   No changes  *If you need a refill on your cardiac medications before your next appointment, please call your pharmacy*   Lab Work: HgbA1c Lipid LP(a)  If you have labs (blood work) drawn today and your tests are completely normal, you will receive your results only by: MyChart Message (if you have MyChart) OR A paper copy in the mail If you have any lab test that is abnormal or we need to change your treatment, we will call you to review the results.   Testing/Procedures: Not  needed   Follow-Up: At [facility], you and your health needs are our priority.  As part of our continuing mission to provide you with exceptional heart care, we have created designated Provider Care Teams.  These Care Teams include your primary Cardiologist (physician) and Advanced Practice  Providers (APPs -  Physician Assistants and Nurse Practitioners) who all work together to provide you with the care you need, when you need it.     Your next appointment:   As needed   The format for your next appointment:   In Person  Provider:   Dub Huntsman, MD      Dispo:  Return if symptoms worsen or fail to improve.   Medication Adjustments/Labs and Tests Ordered: Current medicines are reviewed at length with the patient today.  Concerns regarding medicines are outlined above.  Tests Ordered: Orders Placed This Encounter  Procedures   Hemoglobin A1c   Lipid panel   Lipoprotein A (LPA)   EKG 12-Lead   Medication Changes: No orders of the defined types were placed in this encounter.

## 2024-07-18 LAB — LIPOPROTEIN A (LPA): Lipoprotein (a): 195.9 nmol/L — AB (ref <75.0)

## 2024-07-18 LAB — HEMOGLOBIN A1C
Est. average glucose Bld gHb Est-mCnc: 97 mg/dL
Hgb A1c MFr Bld: 5 % (ref 4.8–5.6)

## 2024-07-18 LAB — LIPID PANEL
Cholesterol, Total: 193 mg/dL (ref 100–199)
HDL: 54 mg/dL (ref >39)
LDL CALC COMMENT:: 3.6 ratio (ref 0.0–4.4)
LDL Chol Calc (NIH): 130 mg/dL — AB (ref 0–99)
Triglycerides: 47 mg/dL (ref 0–149)
VLDL Cholesterol Cal: 9 mg/dL (ref 5–40)

## 2024-07-20 ENCOUNTER — Ambulatory Visit: Payer: Self-pay | Admitting: Cardiology

## 2024-07-20 DIAGNOSIS — E782 Mixed hyperlipidemia: Secondary | ICD-10-CM

## 2024-07-28 DIAGNOSIS — Z130 Encounter for screening for diseases of the blood and blood-forming organs and certain disorders involving the immune mechanism: Secondary | ICD-10-CM | POA: Diagnosis not present

## 2024-07-28 DIAGNOSIS — Z800 Family history of malignant neoplasm of digestive organs: Secondary | ICD-10-CM | POA: Diagnosis not present

## 2024-07-28 DIAGNOSIS — R002 Palpitations: Secondary | ICD-10-CM | POA: Diagnosis not present

## 2024-07-28 DIAGNOSIS — Z1389 Encounter for screening for other disorder: Secondary | ICD-10-CM | POA: Diagnosis not present

## 2024-07-28 DIAGNOSIS — Z01419 Encounter for gynecological examination (general) (routine) without abnormal findings: Secondary | ICD-10-CM | POA: Diagnosis not present

## 2024-07-28 DIAGNOSIS — Z86718 Personal history of other venous thrombosis and embolism: Secondary | ICD-10-CM | POA: Diagnosis not present

## 2024-07-31 DIAGNOSIS — H5711 Ocular pain, right eye: Secondary | ICD-10-CM | POA: Diagnosis not present

## 2024-07-31 DIAGNOSIS — H209 Unspecified iridocyclitis: Secondary | ICD-10-CM | POA: Diagnosis not present

## 2024-08-10 DIAGNOSIS — Z800 Family history of malignant neoplasm of digestive organs: Secondary | ICD-10-CM | POA: Diagnosis not present

## 2024-08-10 DIAGNOSIS — D509 Iron deficiency anemia, unspecified: Secondary | ICD-10-CM | POA: Diagnosis not present

## 2024-08-10 DIAGNOSIS — Z1211 Encounter for screening for malignant neoplasm of colon: Secondary | ICD-10-CM | POA: Diagnosis not present

## 2024-09-08 DIAGNOSIS — Z800 Family history of malignant neoplasm of digestive organs: Secondary | ICD-10-CM | POA: Diagnosis not present

## 2024-09-08 DIAGNOSIS — K648 Other hemorrhoids: Secondary | ICD-10-CM | POA: Diagnosis not present

## 2024-09-08 DIAGNOSIS — Z1211 Encounter for screening for malignant neoplasm of colon: Secondary | ICD-10-CM | POA: Diagnosis not present

## 2024-09-20 DIAGNOSIS — Z0000 Encounter for general adult medical examination without abnormal findings: Secondary | ICD-10-CM | POA: Diagnosis not present
